# Patient Record
Sex: Male | Born: 1950 | Race: White | Hispanic: No | Marital: Married | Smoking: Never smoker
Health system: Southern US, Community
[De-identification: ages and names within clinical notes are randomized; demographics above are authoritative.]

---

## 1997-11-25 ENCOUNTER — Ambulatory Visit (HOSPITAL_COMMUNITY): Admission: RE | Admit: 1997-11-25 | Discharge: 1997-11-25 | Payer: Self-pay | Admitting: Internal Medicine

## 1999-08-04 ENCOUNTER — Encounter: Payer: Self-pay | Admitting: *Deleted

## 1999-08-04 ENCOUNTER — Ambulatory Visit (HOSPITAL_COMMUNITY): Admission: RE | Admit: 1999-08-04 | Discharge: 1999-08-04 | Payer: Self-pay | Admitting: *Deleted

## 2000-01-18 ENCOUNTER — Ambulatory Visit (HOSPITAL_COMMUNITY): Admission: RE | Admit: 2000-01-18 | Discharge: 2000-01-18 | Payer: Self-pay | Admitting: *Deleted

## 2000-01-18 ENCOUNTER — Encounter (INDEPENDENT_AMBULATORY_CARE_PROVIDER_SITE_OTHER): Payer: Self-pay | Admitting: *Deleted

## 2000-06-16 ENCOUNTER — Encounter (INDEPENDENT_AMBULATORY_CARE_PROVIDER_SITE_OTHER): Payer: Self-pay | Admitting: *Deleted

## 2000-06-16 ENCOUNTER — Ambulatory Visit (HOSPITAL_COMMUNITY): Admission: RE | Admit: 2000-06-16 | Discharge: 2000-06-16 | Payer: Self-pay | Admitting: *Deleted

## 2003-06-17 DIAGNOSIS — D126 Benign neoplasm of colon, unspecified: Secondary | ICD-10-CM | POA: Insufficient documentation

## 2003-06-30 ENCOUNTER — Ambulatory Visit (HOSPITAL_COMMUNITY): Admission: RE | Admit: 2003-06-30 | Discharge: 2003-06-30 | Payer: Self-pay | Admitting: *Deleted

## 2006-05-23 ENCOUNTER — Encounter: Admission: RE | Admit: 2006-05-23 | Discharge: 2006-05-23 | Payer: Self-pay | Admitting: Internal Medicine

## 2006-06-15 ENCOUNTER — Encounter (INDEPENDENT_AMBULATORY_CARE_PROVIDER_SITE_OTHER): Payer: Self-pay | Admitting: *Deleted

## 2006-06-15 ENCOUNTER — Other Ambulatory Visit: Admission: RE | Admit: 2006-06-15 | Discharge: 2006-06-15 | Payer: Self-pay | Admitting: Interventional Radiology

## 2006-06-15 ENCOUNTER — Encounter: Admission: RE | Admit: 2006-06-15 | Discharge: 2006-06-15 | Payer: Self-pay | Admitting: Internal Medicine

## 2006-12-11 ENCOUNTER — Ambulatory Visit: Payer: Self-pay | Admitting: Gastroenterology

## 2006-12-11 LAB — CONVERTED CEMR LAB
ALT: 17 units/L (ref 0–53)
AST: 18 units/L (ref 0–37)
Albumin: 3.9 g/dL (ref 3.5–5.2)
Alkaline Phosphatase: 42 units/L (ref 39–117)
Amylase: 99 units/L (ref 27–131)
BUN: 10 mg/dL (ref 6–23)
Basophils Absolute: 0 10*3/uL (ref 0.0–0.1)
Basophils Relative: 0.8 % (ref 0.0–1.0)
Bilirubin, Direct: 0.1 mg/dL (ref 0.0–0.3)
CO2: 30 meq/L (ref 19–32)
Calcium: 9 mg/dL (ref 8.4–10.5)
Chloride: 100 meq/L (ref 96–112)
Creatinine, Ser: 1 mg/dL (ref 0.4–1.5)
Eosinophils Absolute: 0.1 10*3/uL (ref 0.0–0.6)
Eosinophils Relative: 1 % (ref 0.0–5.0)
Ferritin: 152.1 ng/mL (ref 22.0–322.0)
Folate: 8.8 ng/mL
GFR calc Af Amer: 99 mL/min
GFR calc non Af Amer: 82 mL/min
Glucose, Bld: 102 mg/dL — ABNORMAL HIGH (ref 70–99)
HCT: 40.5 % (ref 39.0–52.0)
Hemoglobin: 13.9 g/dL (ref 13.0–17.0)
Iron: 69 ug/dL (ref 42–165)
Lipase: 32 units/L (ref 11.0–59.0)
Lymphocytes Relative: 27.6 % (ref 12.0–46.0)
MCHC: 34.4 g/dL (ref 30.0–36.0)
MCV: 92.5 fL (ref 78.0–100.0)
Monocytes Absolute: 0.5 10*3/uL (ref 0.2–0.7)
Monocytes Relative: 8.7 % (ref 3.0–11.0)
Neutro Abs: 3.3 10*3/uL (ref 1.4–7.7)
Neutrophils Relative %: 61.9 % (ref 43.0–77.0)
Platelets: 305 10*3/uL (ref 150–400)
Potassium: 4.1 meq/L (ref 3.5–5.1)
RBC: 4.38 M/uL (ref 4.22–5.81)
RDW: 12.4 % (ref 11.5–14.6)
Saturation Ratios: 22.3 % (ref 20.0–50.0)
Sodium: 134 meq/L — ABNORMAL LOW (ref 135–145)
TSH: 0.73 microintl units/mL (ref 0.35–5.50)
Total Bilirubin: 0.8 mg/dL (ref 0.3–1.2)
Total Protein: 6.3 g/dL (ref 6.0–8.3)
Transferrin: 221.1 mg/dL (ref >=212.0)
Vitamin B-12: 319 pg/mL (ref 211–911)
WBC: 5.4 10*3/uL (ref 4.5–10.5)

## 2006-12-15 ENCOUNTER — Ambulatory Visit (HOSPITAL_COMMUNITY): Admission: RE | Admit: 2006-12-15 | Discharge: 2006-12-15 | Payer: Self-pay | Admitting: Gastroenterology

## 2007-01-01 ENCOUNTER — Encounter: Payer: Self-pay | Admitting: Gastroenterology

## 2007-01-01 ENCOUNTER — Ambulatory Visit: Payer: Self-pay | Admitting: Gastroenterology

## 2007-01-01 DIAGNOSIS — K2970 Gastritis, unspecified, without bleeding: Secondary | ICD-10-CM | POA: Insufficient documentation

## 2007-01-01 DIAGNOSIS — K2990 Gastroduodenitis, unspecified, without bleeding: Secondary | ICD-10-CM

## 2007-01-02 ENCOUNTER — Ambulatory Visit: Payer: Self-pay | Admitting: Cardiology

## 2007-07-19 DIAGNOSIS — K219 Gastro-esophageal reflux disease without esophagitis: Secondary | ICD-10-CM | POA: Insufficient documentation

## 2008-08-20 IMAGING — US US SOFT TISSUE HEAD/NECK
1 series · 14 of 25 positions shown · non-contrast
Comparison: none

CLINICAL DATA: Thyroid nodule on physical exam. 
 THYROID ULTRASOUND:
TECHNIQUE: Ultrasound examination of the thyroid gland and adjacent soft tissue structures was performed.

[Series 1: unknown · 0.09mm/px · 14 of 37 slices shown]
[im 1/37]
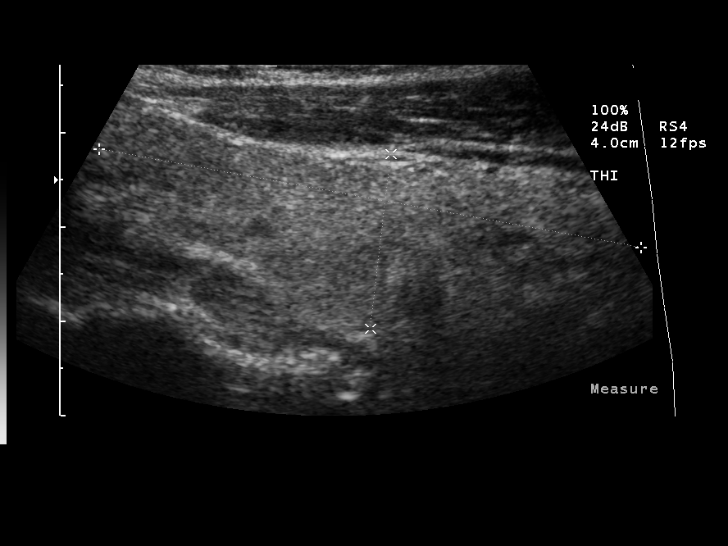
[im 4/37]
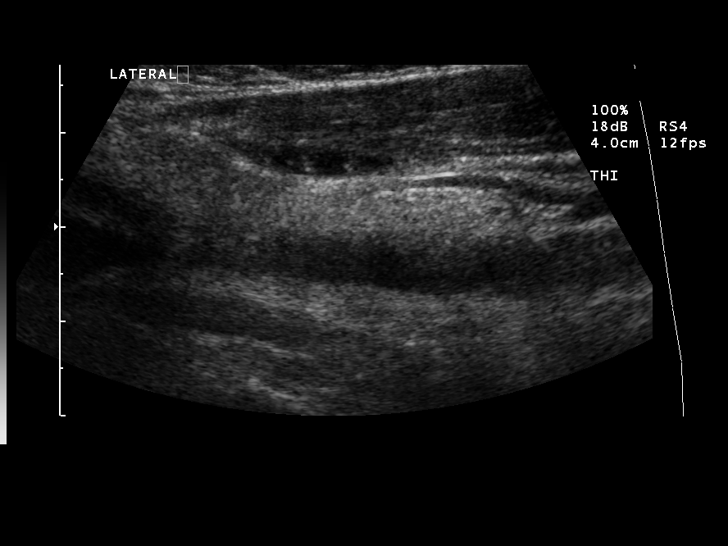
[im 7/37]
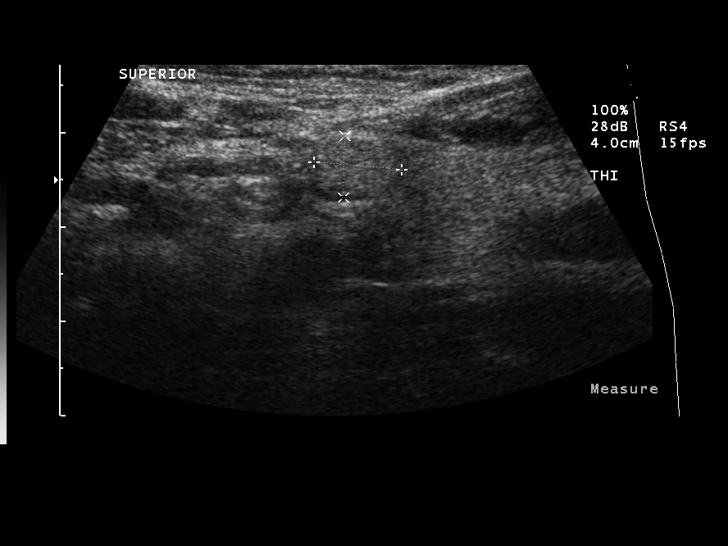
[im 10/37]
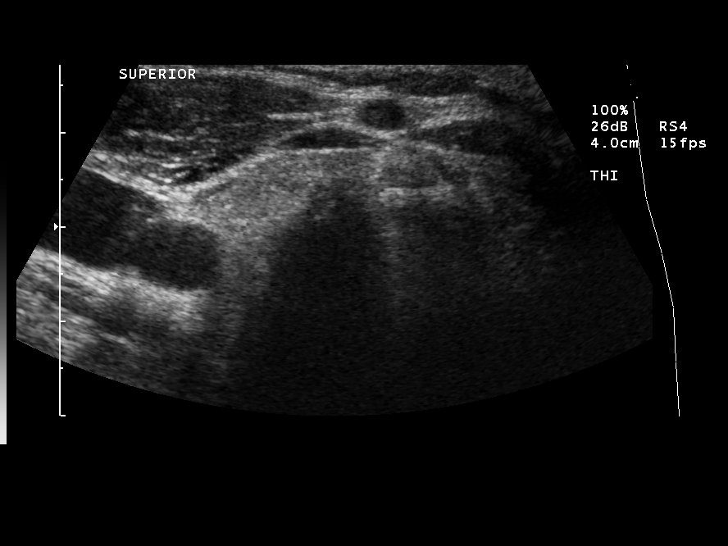
[im 13/37]
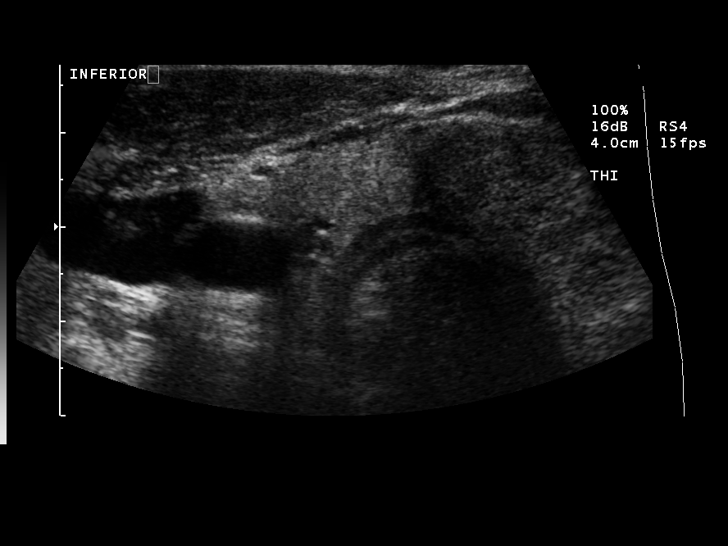
[im 14/37]
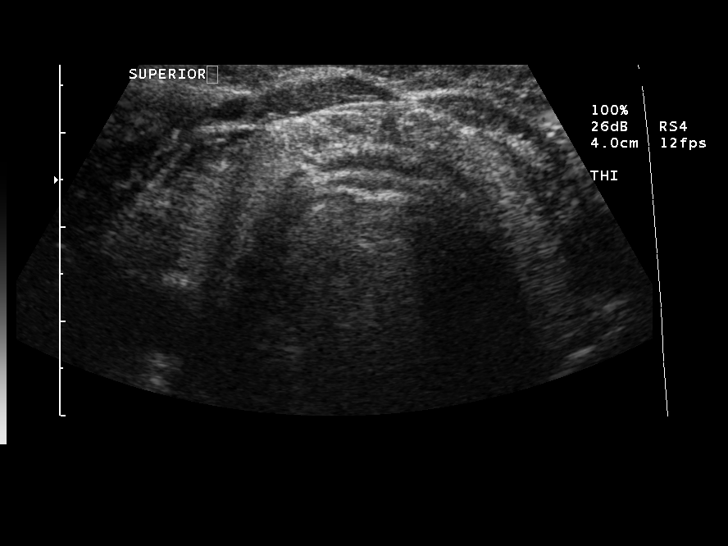
[im 17/37]
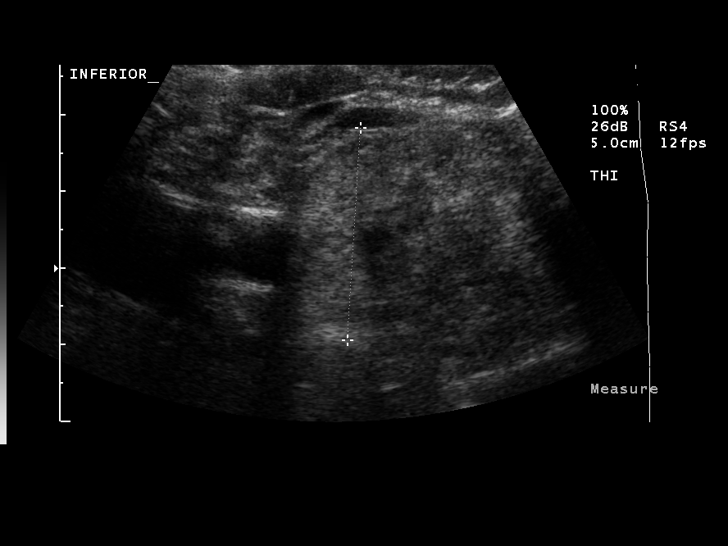
[im 20/37]
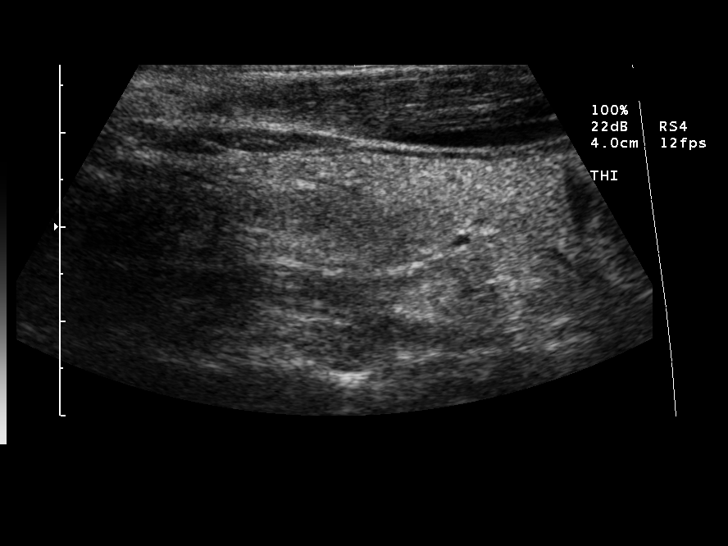
[im 23/37]
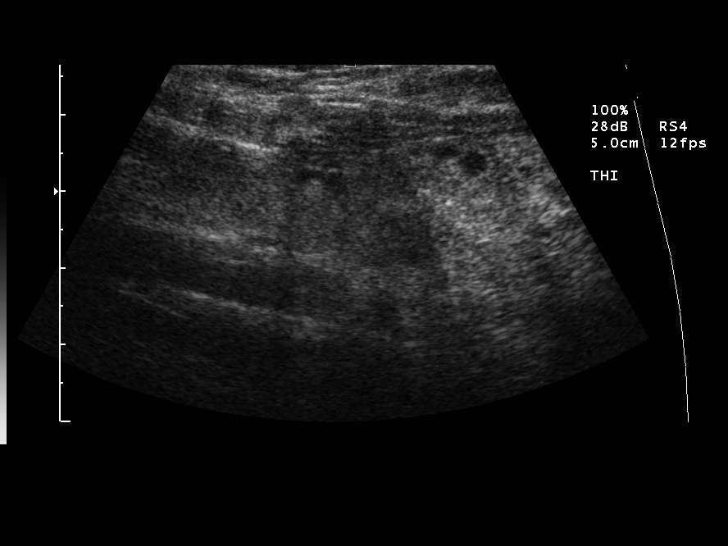
[im 25/37]
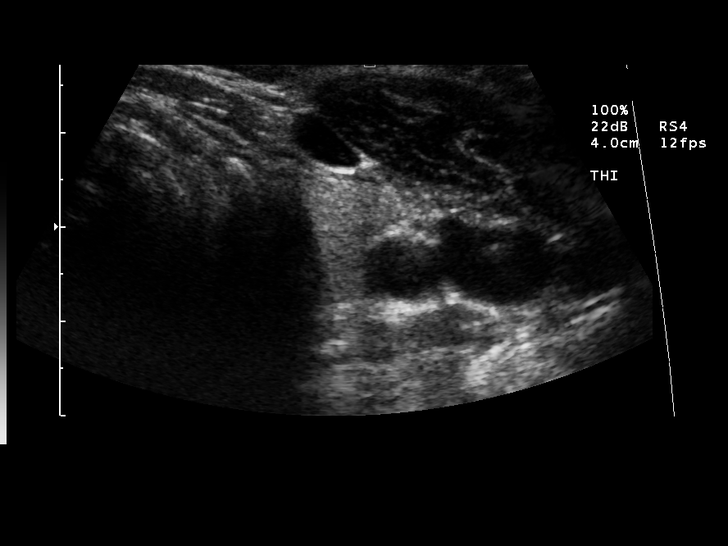
[im 28/37]
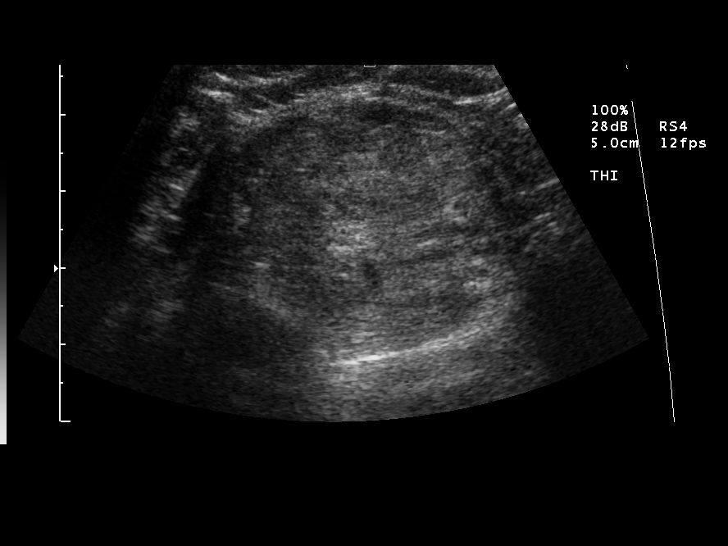
[im 31/37]
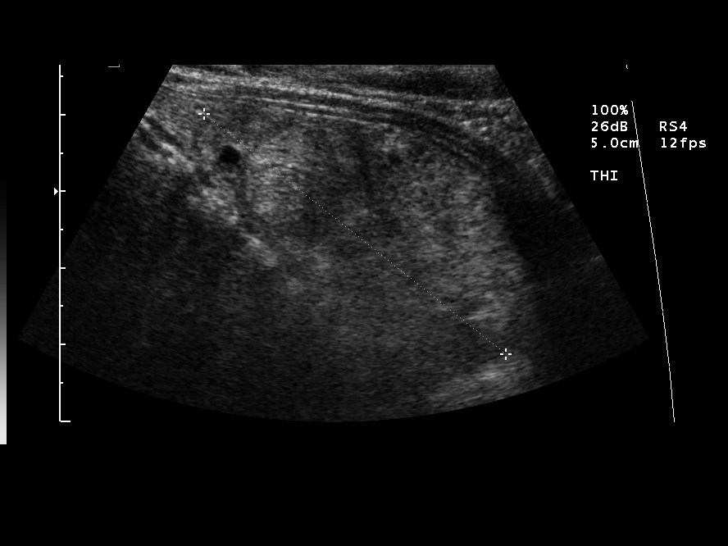
[im 34/37]
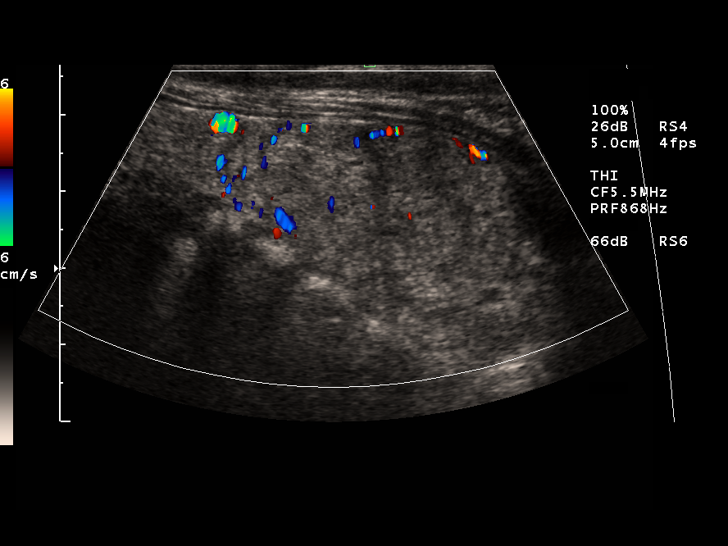
[im 37/37]
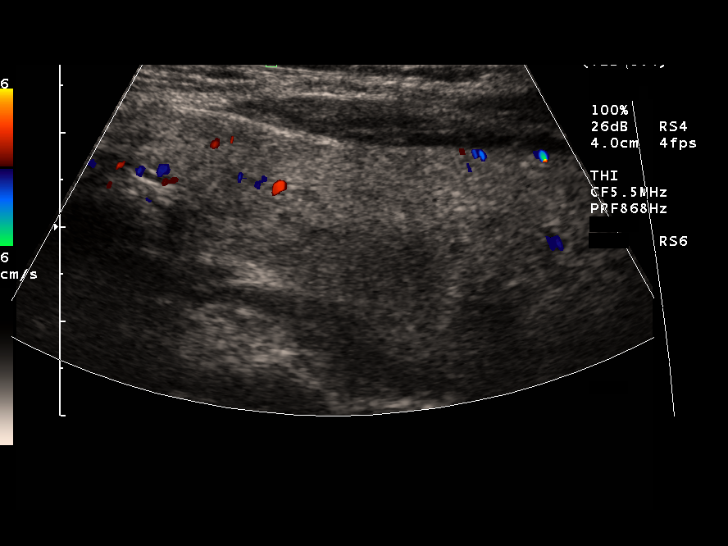

[14 of 25 positions shown; findings below may reference images not displayed]

FINDINGS: Scans over the thyroid gland were performed.   The right lobe of the thyroid is within normal limits in size measuring 5.8 cm sagittally with a depth of 1.9 cm and width of 1.2 cm.  Over the left lobe is enlarged primarily due to a large solid mass in the lower lobe.   The left lobe measures 9.0 x 2.6 x 1.6 cm with the isthmus measuring up to 2.8 cm as a result of this mass.  This mass is primarily solid measuring 5.0 x 2.6 x 5.1 cm and biopsy is recommended.
IMPRESSION: Primarily solid dominant mass in the left lower pole of 5 x 2.6 x 5.1 cm.  Consider biopsy.

## 2010-05-09 ENCOUNTER — Encounter: Payer: Self-pay | Admitting: Internal Medicine

## 2010-08-31 NOTE — Assessment & Plan Note (Signed)
[facility]                         GASTROENTEROLOGY OFFICE NOTE   GARRETTE, CAINE                         MRN:          [id number]  DATE:12/11/2006                            DOB:          03-Nov-1950    Mr. Errico is a patient of Dr. Zollie Beckers Pharr's and Dr. Sabino Gasser.  He is  self-referred today for evaluation of persistent recurrent chest pain.   Mr. Broadnax has a long history of acid reflux, has had endoscopies and  colonoscopies in the past with Dr. Virginia Rochester.  His last procedure was several  years ago and showed no evidence of Barrett's mucosa.  He has had  several colon polyps with followup colonoscopy also in March of 2005  which was normal.   For the last several weeks Adi has had constant pressing pain in his  chest area with regurgitation, belching and burping.  He has had no  associated dysphagia or hepatobiliary complaints.  He has been switched  from Prilosec to Protonix and has had his dose increased from 40 mg a  day to twice a day dosing, continues to be symptomatic.  Associated with  this has been panic attacks and anxiety, partially relieved by taking  Klonopin 0.5 mg twice a day.  The patient does use p.r.n. antacids.  He  additionally has insomnia, takes Ambien 12.5 mg at bedtime.   He denies clay colored stools, dark urine, icterus, fever or chills,  anorexia or weight loss.  He says he has had a cardiac evaluation at  [facility] which has been unremarkable.  It is unclear to me if  he has had a stress test.  He gives no history of hyperlipidemia,  hypertension, or family history or heart disease.  He has made multiple  dietary adjustment, he is on a fairly strict diet to control his acid  reflux but continues to be symptomatic.  I can not see where he has ever  had gallbladder ultrasound exam.   Patient in the past has had barium swallows with exam in April of 2001  showing evidence of reflux esophagitis.  The patient also  has had a  recent thyroid biopsy which was unremarkable in February of 2008.   Past medical history otherwise remarkable for possible depression.  He  has been on Zoloft and Effexor but could not tolerate these medications  because of worsening of his chest pain.  He has never had previous  abdominal surgery.   FAMILY HISTORY:  Entirely noncontributory.   SOCIAL HISTORY:  He is married, lives with his wife.  He has a Systems analyst.  Works as an Arts development officer.  He does not smoke and has  not ever been a heavy user of alcohol.   MEDICATIONS:  1. Protonix 40 mg twice a day.  2. Ambien 12.5 mg nightly.  3. Klonopin 0.25 mg twice a day.  4. Vitamin E 400 units a day.   HE HAS A HISTORY OF SULFUR SENSITIVITY.   REVIEW OF SYSTEMS:  Noncontributory except for new onset anxiety,  depression and insomnia.  He denies any  hallucinations, delusions,  suicidal thoughts.  He denies chest pain on exertion, palpitations,  cough, sputum production, hemoptysis, fever or chills.  Review of  systems otherwise noncontributory.   EXAMINATION:  He is a healthy-appearing white male in no distress  appearing his stated age.  I can not appreciate stigmata of chronic  liver disease.  He is 6 feet 2 inches tall and weighs 179 pounds.  Blood  pressure 126/72 and pulse was 72 and regular.  Could not appreciate  thyromegaly or lymphadenopathy.  CHEST:  Clear to percussion and auscultation.  I could not appreciate  murmurs, gallops or rubs.  He appeared to be in a regular rhythm.  There was no hepatosplenomegaly, abdominal masses or tenderness.  He had  some discomfort of his xyphoid process.  Abdominal exam otherwise was  unremarkable and bowel sounds were normal.  PERIPHERAL EXTREMITIES:  Unremarkable.  Mental status was clear.   ASSESSMENT:  1. Chronic acid reflux - rule out cholelithiasis.  2. A lot of his symptoms do seem consistent with anxiety and panic      attacks.  3. History of colon  polyps with routine colonoscopy by Dr. Virginia Rochester.   RECOMMENDATIONS:  1. Continue reflux regimen and twice a day PPI therapy and I will add      Carafate suspension 1 tablespoon after meals and at bedtime.  2. P.r.n. Levsin every 6-8 hours as needed.  3. Strict anti-reflux maneuvers reviewed with patient.  4. Outpatient ultrasound and endoscopy.  5. Check screening laboratory parameters including liver functios,      amylase, lipase, sed rate.  6. Consider exercise stress testing as needed depending on his      clinical course and clinical workup.     Vania Rea. Jarold Motto, MD, Caleen Essex, FAGA  Electronically Signed    DRP/MedQ  DD: 12/11/2006  DT: 12/12/2006  Job #: [id number]   cc:   Soyla Murphy. Renne Crigler, M.D.

## 2010-09-03 NOTE — Op Note (Signed)
NAME:  Robert Cline, Robert Cline                            ACCOUNT NO.:  [id number]   MEDICAL RECORD NO.:  [id number]                   PATIENT TYPE:  AMB   LOCATION:  ENDO                                 FACILITY:  [facility]   PHYSICIAN:  Georgiana Spinner, M.D.                 DATE OF BIRTH:  21-Dec-1950   DATE OF PROCEDURE:  DATE OF DISCHARGE:                                 OPERATIVE REPORT   PROCEDURE:  Colonoscopy.   INDICATIONS:  Colon polyps.   ANESTHESIA:  Demerol 60 mg, Versed 5 mg IV.   DESCRIPTION OF PROCEDURE:  With the patient mildly sedated and in the left  lateral decubitus position, a rectal exam was performed which was  unremarkable.  Subsequently the Olympus videoscopic colonoscope was inserted  in the rectum and passed under direct vision to the cecum, identified by  ileocecal valve and appendiceal orifice, both of which were photographed.  From this point,  the colonoscope was slowly withdrawn, taking  circumferential views of the colonic mucosa, stopping in the rectum which  appeared normal on direct and showed small hemorrhoids on retroflexed view.  The endoscope was straightened and withdrawn.  The patient's vital signs and  pulse oximetry remained stable.  The patient tolerated the procedure well  without apparent complications.   FINDINGS:  Internal hemorrhoids; otherwise unremarkable exam.   PLAN:  Repeat examination in five years due to his previous polyps.                                               Georgiana Spinner, M.D.    GMO/MEDQ  D:  06/30/2003  T:  06/30/2003  Job:  [id number]

## 2010-09-03 NOTE — Procedures (Signed)
[place]  Patient:    Robert Cline, Robert Cline                           MRN: [id number] Adm. Date:  [id number] Attending:  Sabino Gasser                           Procedure Report  PROCEDURE:  Colonoscopy.  INDICATION:  Colon polyps.  ANESTHESIA:  Demerol 50 mg, Versed 3 mg.  DESCRIPTION OF PROCEDURE:  With patient mildly sedated in the left lateral decubitus position, a rectal exam was performed.  Subsequently, the Olympus videoscopic colonoscope, variable-stiffness, was inserted in the rectum, passed under direct vision to the cecum, identified by the ileocecal valve and appendiceal orifice, both of which were photographed.  From this point, the colonoscope was slowly withdrawn, taking circumferential views of the entire colonic mucosa, stopping in the descending colon, where a small polyp was seen, photographed, and removed using hot biopsy forceps technique staying with 20-20 blended current.  The endoscope was withdrawn all the way to the rectum, which appeared normal on direct and retroflex view.  The endoscope was straightened and withdrawn.  The patients vital signs and pulse oximetry remained stable.  The patient tolerated the procedure well with no apparent complications.  FINDINGS:  Polyp of descending colon, removed.  Await biopsy report.  The patient will call me for results and follow up as an outpatient. DD:  06/16/00 TD:  06/16/00 Job: [id number]

## 2012-10-12 ENCOUNTER — Other Ambulatory Visit (HOSPITAL_COMMUNITY): Payer: Self-pay | Admitting: Urology

## 2012-10-12 DIAGNOSIS — R972 Elevated prostate specific antigen [PSA]: Secondary | ICD-10-CM

## 2012-10-31 ENCOUNTER — Other Ambulatory Visit (HOSPITAL_COMMUNITY): Payer: Self-pay

## 2012-10-31 ENCOUNTER — Ambulatory Visit (HOSPITAL_COMMUNITY)
Admission: RE | Admit: 2012-10-31 | Discharge: 2012-10-31 | Disposition: A | Discharge status: Home / Self Care | Payer: BC Managed Care – PPO | Source: Ambulatory Visit | Attending: Urology | Admitting: Urology

## 2012-10-31 DIAGNOSIS — R972 Elevated prostate specific antigen [PSA]: Secondary | ICD-10-CM

## 2012-10-31 DIAGNOSIS — N402 Nodular prostate without lower urinary tract symptoms: Secondary | ICD-10-CM | POA: Insufficient documentation

## 2012-10-31 MED ORDER — GADOBENATE DIMEGLUMINE 529 MG/ML IV SOLN
17.0000 mL | Freq: Once | INTRAVENOUS | Status: AC | PRN
  Administered 2012-10-31: 17 mL via INTRAVENOUS

## 2012-11-12 ENCOUNTER — Encounter: Payer: Self-pay | Admitting: Cardiology

## 2013-02-27 ENCOUNTER — Encounter: Payer: Self-pay | Admitting: Cardiovascular Disease

## 2014-10-24 DIAGNOSIS — Z125 Encounter for screening for malignant neoplasm of prostate: Secondary | ICD-10-CM | POA: Diagnosis not present

## 2014-10-24 DIAGNOSIS — D72819 Decreased white blood cell count, unspecified: Secondary | ICD-10-CM | POA: Diagnosis not present

## 2014-10-24 DIAGNOSIS — K219 Gastro-esophageal reflux disease without esophagitis: Secondary | ICD-10-CM | POA: Diagnosis not present

## 2014-11-07 DIAGNOSIS — Z23 Encounter for immunization: Secondary | ICD-10-CM | POA: Diagnosis not present

## 2014-11-07 DIAGNOSIS — D72819 Decreased white blood cell count, unspecified: Secondary | ICD-10-CM | POA: Diagnosis not present

## 2014-11-07 DIAGNOSIS — F5104 Psychophysiologic insomnia: Secondary | ICD-10-CM | POA: Diagnosis not present

## 2014-11-07 DIAGNOSIS — Z87891 Personal history of nicotine dependence: Secondary | ICD-10-CM | POA: Diagnosis not present

## 2014-11-07 DIAGNOSIS — N401 Enlarged prostate with lower urinary tract symptoms: Secondary | ICD-10-CM | POA: Diagnosis not present

## 2014-12-03 DIAGNOSIS — N390 Urinary tract infection, site not specified: Secondary | ICD-10-CM | POA: Diagnosis not present

## 2014-12-03 DIAGNOSIS — F411 Generalized anxiety disorder: Secondary | ICD-10-CM | POA: Diagnosis not present

## 2015-02-13 DIAGNOSIS — R972 Elevated prostate specific antigen [PSA]: Secondary | ICD-10-CM | POA: Diagnosis not present

## 2015-02-13 DIAGNOSIS — N400 Enlarged prostate without lower urinary tract symptoms: Secondary | ICD-10-CM | POA: Diagnosis not present

## 2015-02-16 DIAGNOSIS — Z23 Encounter for immunization: Secondary | ICD-10-CM | POA: Diagnosis not present

## 2015-02-16 DIAGNOSIS — J387 Other diseases of larynx: Secondary | ICD-10-CM | POA: Diagnosis not present

## 2015-02-16 DIAGNOSIS — R972 Elevated prostate specific antigen [PSA]: Secondary | ICD-10-CM | POA: Diagnosis not present

## 2015-03-25 DIAGNOSIS — F411 Generalized anxiety disorder: Secondary | ICD-10-CM | POA: Diagnosis not present

## 2015-05-11 DIAGNOSIS — Z86010 Personal history of colonic polyps: Secondary | ICD-10-CM | POA: Diagnosis not present

## 2015-05-11 DIAGNOSIS — K210 Gastro-esophageal reflux disease with esophagitis: Secondary | ICD-10-CM | POA: Diagnosis not present

## 2015-05-19 DIAGNOSIS — R972 Elevated prostate specific antigen [PSA]: Secondary | ICD-10-CM | POA: Diagnosis not present

## 2015-05-21 DIAGNOSIS — R972 Elevated prostate specific antigen [PSA]: Secondary | ICD-10-CM | POA: Diagnosis not present

## 2015-05-27 ENCOUNTER — Other Ambulatory Visit: Payer: Self-pay | Admitting: Gastroenterology

## 2015-05-27 DIAGNOSIS — D126 Benign neoplasm of colon, unspecified: Secondary | ICD-10-CM | POA: Diagnosis not present

## 2015-05-27 DIAGNOSIS — K621 Rectal polyp: Secondary | ICD-10-CM | POA: Diagnosis not present

## 2015-05-27 DIAGNOSIS — K219 Gastro-esophageal reflux disease without esophagitis: Secondary | ICD-10-CM | POA: Diagnosis not present

## 2015-05-27 DIAGNOSIS — K449 Diaphragmatic hernia without obstruction or gangrene: Secondary | ICD-10-CM | POA: Diagnosis not present

## 2015-05-27 DIAGNOSIS — D125 Benign neoplasm of sigmoid colon: Secondary | ICD-10-CM | POA: Diagnosis not present

## 2015-05-27 DIAGNOSIS — K5730 Diverticulosis of large intestine without perforation or abscess without bleeding: Secondary | ICD-10-CM | POA: Diagnosis not present

## 2015-05-27 DIAGNOSIS — Z86010 Personal history of colonic polyps: Secondary | ICD-10-CM | POA: Diagnosis not present

## 2015-06-10 DIAGNOSIS — Z0000 Encounter for general adult medical examination without abnormal findings: Secondary | ICD-10-CM | POA: Diagnosis not present

## 2015-06-10 DIAGNOSIS — R972 Elevated prostate specific antigen [PSA]: Secondary | ICD-10-CM | POA: Diagnosis not present

## 2015-06-22 DIAGNOSIS — R400 Somnolence: Secondary | ICD-10-CM | POA: Diagnosis not present

## 2015-06-22 DIAGNOSIS — R972 Elevated prostate specific antigen [PSA]: Secondary | ICD-10-CM | POA: Diagnosis not present

## 2015-06-22 DIAGNOSIS — Z0000 Encounter for general adult medical examination without abnormal findings: Secondary | ICD-10-CM | POA: Diagnosis not present

## 2015-06-22 DIAGNOSIS — N400 Enlarged prostate without lower urinary tract symptoms: Secondary | ICD-10-CM | POA: Diagnosis not present

## 2015-06-26 DIAGNOSIS — H18891 Other specified disorders of cornea, right eye: Secondary | ICD-10-CM | POA: Diagnosis not present

## 2015-06-26 DIAGNOSIS — Q134 Other congenital corneal malformations: Secondary | ICD-10-CM | POA: Diagnosis not present

## 2015-07-22 DIAGNOSIS — E0789 Other specified disorders of thyroid: Secondary | ICD-10-CM | POA: Diagnosis not present

## 2015-07-29 DIAGNOSIS — E032 Hypothyroidism due to medicaments and other exogenous substances: Secondary | ICD-10-CM | POA: Diagnosis not present

## 2015-08-28 DIAGNOSIS — H2513 Age-related nuclear cataract, bilateral: Secondary | ICD-10-CM | POA: Diagnosis not present

## 2015-08-28 DIAGNOSIS — H40013 Open angle with borderline findings, low risk, bilateral: Secondary | ICD-10-CM | POA: Diagnosis not present

## 2015-08-28 DIAGNOSIS — H25013 Cortical age-related cataract, bilateral: Secondary | ICD-10-CM | POA: Diagnosis not present

## 2015-08-28 DIAGNOSIS — H43813 Vitreous degeneration, bilateral: Secondary | ICD-10-CM | POA: Diagnosis not present

## 2015-09-09 DIAGNOSIS — F411 Generalized anxiety disorder: Secondary | ICD-10-CM | POA: Diagnosis not present

## 2015-11-04 DIAGNOSIS — N183 Chronic kidney disease, stage 3 (moderate): Secondary | ICD-10-CM | POA: Diagnosis not present

## 2015-11-04 DIAGNOSIS — D72819 Decreased white blood cell count, unspecified: Secondary | ICD-10-CM | POA: Diagnosis not present

## 2015-11-04 DIAGNOSIS — K219 Gastro-esophageal reflux disease without esophagitis: Secondary | ICD-10-CM | POA: Diagnosis not present

## 2015-11-04 DIAGNOSIS — Z0000 Encounter for general adult medical examination without abnormal findings: Secondary | ICD-10-CM | POA: Diagnosis not present

## 2015-11-11 DIAGNOSIS — N486 Induration penis plastica: Secondary | ICD-10-CM | POA: Diagnosis not present

## 2015-11-11 DIAGNOSIS — D72819 Decreased white blood cell count, unspecified: Secondary | ICD-10-CM | POA: Diagnosis not present

## 2015-11-11 DIAGNOSIS — L570 Actinic keratosis: Secondary | ICD-10-CM | POA: Diagnosis not present

## 2015-11-11 DIAGNOSIS — J069 Acute upper respiratory infection, unspecified: Secondary | ICD-10-CM | POA: Diagnosis not present

## 2015-11-11 DIAGNOSIS — F419 Anxiety disorder, unspecified: Secondary | ICD-10-CM | POA: Diagnosis not present

## 2015-11-11 DIAGNOSIS — G4733 Obstructive sleep apnea (adult) (pediatric): Secondary | ICD-10-CM | POA: Diagnosis not present

## 2015-11-26 DIAGNOSIS — G4733 Obstructive sleep apnea (adult) (pediatric): Secondary | ICD-10-CM | POA: Diagnosis not present

## 2015-12-28 DIAGNOSIS — R972 Elevated prostate specific antigen [PSA]: Secondary | ICD-10-CM | POA: Diagnosis not present

## 2016-01-18 DIAGNOSIS — R972 Elevated prostate specific antigen [PSA]: Secondary | ICD-10-CM | POA: Diagnosis not present

## 2016-01-18 DIAGNOSIS — N400 Enlarged prostate without lower urinary tract symptoms: Secondary | ICD-10-CM | POA: Diagnosis not present

## 2016-02-22 DIAGNOSIS — H179 Unspecified corneal scar and opacity: Secondary | ICD-10-CM | POA: Diagnosis not present

## 2016-02-22 DIAGNOSIS — H40013 Open angle with borderline findings, low risk, bilateral: Secondary | ICD-10-CM | POA: Diagnosis not present

## 2016-02-23 DIAGNOSIS — Z23 Encounter for immunization: Secondary | ICD-10-CM | POA: Diagnosis not present

## 2016-03-02 DIAGNOSIS — F411 Generalized anxiety disorder: Secondary | ICD-10-CM | POA: Diagnosis not present

## 2016-04-27 DIAGNOSIS — R69 Illness, unspecified: Secondary | ICD-10-CM | POA: Diagnosis not present

## 2016-06-20 DIAGNOSIS — R972 Elevated prostate specific antigen [PSA]: Secondary | ICD-10-CM | POA: Diagnosis not present

## 2016-06-27 DIAGNOSIS — R972 Elevated prostate specific antigen [PSA]: Secondary | ICD-10-CM | POA: Diagnosis not present

## 2016-06-27 DIAGNOSIS — N400 Enlarged prostate without lower urinary tract symptoms: Secondary | ICD-10-CM | POA: Diagnosis not present

## 2016-07-22 DIAGNOSIS — E0789 Other specified disorders of thyroid: Secondary | ICD-10-CM | POA: Diagnosis not present

## 2016-08-03 DIAGNOSIS — G4733 Obstructive sleep apnea (adult) (pediatric): Secondary | ICD-10-CM | POA: Diagnosis not present

## 2016-08-03 DIAGNOSIS — N400 Enlarged prostate without lower urinary tract symptoms: Secondary | ICD-10-CM | POA: Diagnosis not present

## 2016-08-03 DIAGNOSIS — E032 Hypothyroidism due to medicaments and other exogenous substances: Secondary | ICD-10-CM | POA: Diagnosis not present

## 2016-08-29 DIAGNOSIS — H40013 Open angle with borderline findings, low risk, bilateral: Secondary | ICD-10-CM | POA: Diagnosis not present

## 2016-08-29 DIAGNOSIS — H43813 Vitreous degeneration, bilateral: Secondary | ICD-10-CM | POA: Diagnosis not present

## 2016-08-29 DIAGNOSIS — H2513 Age-related nuclear cataract, bilateral: Secondary | ICD-10-CM | POA: Diagnosis not present

## 2016-08-29 DIAGNOSIS — H25013 Cortical age-related cataract, bilateral: Secondary | ICD-10-CM | POA: Diagnosis not present

## 2016-09-06 DIAGNOSIS — R69 Illness, unspecified: Secondary | ICD-10-CM | POA: Diagnosis not present

## 2016-10-26 DIAGNOSIS — R69 Illness, unspecified: Secondary | ICD-10-CM | POA: Diagnosis not present

## 2016-12-09 DIAGNOSIS — D72819 Decreased white blood cell count, unspecified: Secondary | ICD-10-CM | POA: Diagnosis not present

## 2016-12-09 DIAGNOSIS — N183 Chronic kidney disease, stage 3 (moderate): Secondary | ICD-10-CM | POA: Diagnosis not present

## 2016-12-09 DIAGNOSIS — Z13220 Encounter for screening for lipoid disorders: Secondary | ICD-10-CM | POA: Diagnosis not present

## 2016-12-09 DIAGNOSIS — Z125 Encounter for screening for malignant neoplasm of prostate: Secondary | ICD-10-CM | POA: Diagnosis not present

## 2016-12-20 DIAGNOSIS — Z0001 Encounter for general adult medical examination with abnormal findings: Secondary | ICD-10-CM | POA: Diagnosis not present

## 2016-12-20 DIAGNOSIS — N401 Enlarged prostate with lower urinary tract symptoms: Secondary | ICD-10-CM | POA: Diagnosis not present

## 2016-12-20 DIAGNOSIS — L570 Actinic keratosis: Secondary | ICD-10-CM | POA: Diagnosis not present

## 2016-12-20 DIAGNOSIS — Z86010 Personal history of colonic polyps: Secondary | ICD-10-CM | POA: Diagnosis not present

## 2016-12-21 DIAGNOSIS — R972 Elevated prostate specific antigen [PSA]: Secondary | ICD-10-CM | POA: Diagnosis not present

## 2017-01-16 DIAGNOSIS — N400 Enlarged prostate without lower urinary tract symptoms: Secondary | ICD-10-CM | POA: Diagnosis not present

## 2017-01-16 DIAGNOSIS — R972 Elevated prostate specific antigen [PSA]: Secondary | ICD-10-CM | POA: Diagnosis not present

## 2017-01-31 DIAGNOSIS — R69 Illness, unspecified: Secondary | ICD-10-CM | POA: Diagnosis not present

## 2017-03-02 DIAGNOSIS — R69 Illness, unspecified: Secondary | ICD-10-CM | POA: Diagnosis not present

## 2017-03-03 DIAGNOSIS — H40013 Open angle with borderline findings, low risk, bilateral: Secondary | ICD-10-CM | POA: Diagnosis not present

## 2017-04-20 DIAGNOSIS — E0789 Other specified disorders of thyroid: Secondary | ICD-10-CM | POA: Diagnosis not present

## 2017-04-24 DIAGNOSIS — E042 Nontoxic multinodular goiter: Secondary | ICD-10-CM | POA: Diagnosis not present

## 2017-04-24 DIAGNOSIS — E039 Hypothyroidism, unspecified: Secondary | ICD-10-CM | POA: Diagnosis not present

## 2017-04-24 DIAGNOSIS — M8580 Other specified disorders of bone density and structure, unspecified site: Secondary | ICD-10-CM | POA: Diagnosis not present

## 2017-04-24 DIAGNOSIS — E041 Nontoxic single thyroid nodule: Secondary | ICD-10-CM | POA: Diagnosis not present

## 2017-04-25 ENCOUNTER — Other Ambulatory Visit: Payer: Self-pay | Admitting: Internal Medicine

## 2017-04-25 DIAGNOSIS — E049 Nontoxic goiter, unspecified: Secondary | ICD-10-CM

## 2017-04-26 DIAGNOSIS — M810 Age-related osteoporosis without current pathological fracture: Secondary | ICD-10-CM | POA: Diagnosis not present

## 2017-04-28 ENCOUNTER — Ambulatory Visit
Admission: RE | Admit: 2017-04-28 | Discharge: 2017-04-28 | Disposition: A | Discharge status: Home / Self Care | Payer: Medicare HMO | Source: Ambulatory Visit | Attending: Internal Medicine | Admitting: Internal Medicine

## 2017-04-28 DIAGNOSIS — E042 Nontoxic multinodular goiter: Secondary | ICD-10-CM | POA: Diagnosis not present

## 2017-04-28 DIAGNOSIS — E049 Nontoxic goiter, unspecified: Secondary | ICD-10-CM

## 2017-05-09 DIAGNOSIS — R69 Illness, unspecified: Secondary | ICD-10-CM | POA: Diagnosis not present

## 2017-05-12 ENCOUNTER — Other Ambulatory Visit: Payer: Self-pay | Admitting: Internal Medicine

## 2017-05-12 DIAGNOSIS — E041 Nontoxic single thyroid nodule: Secondary | ICD-10-CM

## 2017-05-18 ENCOUNTER — Ambulatory Visit
Admission: RE | Admit: 2017-05-18 | Discharge: 2017-05-18 | Disposition: A | Discharge status: Home / Self Care | Payer: Medicare HMO | Source: Ambulatory Visit | Attending: Internal Medicine | Admitting: Internal Medicine

## 2017-05-18 ENCOUNTER — Other Ambulatory Visit: Payer: Self-pay | Admitting: Internal Medicine

## 2017-05-18 DIAGNOSIS — E041 Nontoxic single thyroid nodule: Secondary | ICD-10-CM

## 2017-07-14 DIAGNOSIS — E042 Nontoxic multinodular goiter: Secondary | ICD-10-CM | POA: Diagnosis not present

## 2017-07-14 DIAGNOSIS — E559 Vitamin D deficiency, unspecified: Secondary | ICD-10-CM | POA: Diagnosis not present

## 2017-07-14 DIAGNOSIS — M8580 Other specified disorders of bone density and structure, unspecified site: Secondary | ICD-10-CM | POA: Diagnosis not present

## 2017-07-20 DIAGNOSIS — R972 Elevated prostate specific antigen [PSA]: Secondary | ICD-10-CM | POA: Diagnosis not present

## 2017-07-24 DIAGNOSIS — E041 Nontoxic single thyroid nodule: Secondary | ICD-10-CM | POA: Diagnosis not present

## 2017-07-24 DIAGNOSIS — M8580 Other specified disorders of bone density and structure, unspecified site: Secondary | ICD-10-CM | POA: Diagnosis not present

## 2017-07-24 DIAGNOSIS — E039 Hypothyroidism, unspecified: Secondary | ICD-10-CM | POA: Diagnosis not present

## 2017-07-24 DIAGNOSIS — E042 Nontoxic multinodular goiter: Secondary | ICD-10-CM | POA: Diagnosis not present

## 2017-08-28 DIAGNOSIS — R972 Elevated prostate specific antigen [PSA]: Secondary | ICD-10-CM | POA: Diagnosis not present

## 2017-08-28 DIAGNOSIS — N400 Enlarged prostate without lower urinary tract symptoms: Secondary | ICD-10-CM | POA: Diagnosis not present

## 2017-10-27 DIAGNOSIS — H40013 Open angle with borderline findings, low risk, bilateral: Secondary | ICD-10-CM | POA: Diagnosis not present

## 2017-10-27 DIAGNOSIS — H2513 Age-related nuclear cataract, bilateral: Secondary | ICD-10-CM | POA: Diagnosis not present

## 2017-10-27 DIAGNOSIS — H35033 Hypertensive retinopathy, bilateral: Secondary | ICD-10-CM | POA: Diagnosis not present

## 2017-10-27 DIAGNOSIS — H43812 Vitreous degeneration, left eye: Secondary | ICD-10-CM | POA: Diagnosis not present

## 2017-11-08 DIAGNOSIS — R69 Illness, unspecified: Secondary | ICD-10-CM | POA: Diagnosis not present

## 2017-12-12 DIAGNOSIS — R972 Elevated prostate specific antigen [PSA]: Secondary | ICD-10-CM | POA: Diagnosis not present

## 2017-12-12 DIAGNOSIS — N400 Enlarged prostate without lower urinary tract symptoms: Secondary | ICD-10-CM | POA: Diagnosis not present

## 2017-12-25 DIAGNOSIS — N183 Chronic kidney disease, stage 3 (moderate): Secondary | ICD-10-CM | POA: Diagnosis not present

## 2017-12-25 DIAGNOSIS — D72819 Decreased white blood cell count, unspecified: Secondary | ICD-10-CM | POA: Diagnosis not present

## 2017-12-25 DIAGNOSIS — Z79899 Other long term (current) drug therapy: Secondary | ICD-10-CM | POA: Diagnosis not present

## 2017-12-25 DIAGNOSIS — Z125 Encounter for screening for malignant neoplasm of prostate: Secondary | ICD-10-CM | POA: Diagnosis not present

## 2017-12-28 DIAGNOSIS — Z0000 Encounter for general adult medical examination without abnormal findings: Secondary | ICD-10-CM | POA: Diagnosis not present

## 2017-12-28 DIAGNOSIS — N486 Induration penis plastica: Secondary | ICD-10-CM | POA: Diagnosis not present

## 2017-12-28 DIAGNOSIS — L570 Actinic keratosis: Secondary | ICD-10-CM | POA: Diagnosis not present

## 2017-12-28 DIAGNOSIS — R69 Illness, unspecified: Secondary | ICD-10-CM | POA: Diagnosis not present

## 2017-12-28 DIAGNOSIS — M7740 Metatarsalgia, unspecified foot: Secondary | ICD-10-CM | POA: Diagnosis not present

## 2017-12-28 DIAGNOSIS — E049 Nontoxic goiter, unspecified: Secondary | ICD-10-CM | POA: Diagnosis not present

## 2017-12-28 DIAGNOSIS — Z23 Encounter for immunization: Secondary | ICD-10-CM | POA: Diagnosis not present

## 2017-12-28 DIAGNOSIS — N401 Enlarged prostate with lower urinary tract symptoms: Secondary | ICD-10-CM | POA: Diagnosis not present

## 2018-02-16 DIAGNOSIS — F411 Generalized anxiety disorder: Secondary | ICD-10-CM

## 2018-02-16 DIAGNOSIS — F3342 Major depressive disorder, recurrent, in full remission: Secondary | ICD-10-CM

## 2018-03-05 ENCOUNTER — Ambulatory Visit (INDEPENDENT_AMBULATORY_CARE_PROVIDER_SITE_OTHER): Payer: Medicare HMO | Admitting: Psychiatry

## 2018-03-05 ENCOUNTER — Encounter (INDEPENDENT_AMBULATORY_CARE_PROVIDER_SITE_OTHER): Payer: Self-pay

## 2018-03-05 ENCOUNTER — Encounter: Payer: Self-pay | Admitting: Psychiatry

## 2018-03-05 VITALS — BP 114/76 | HR 68 | Ht 73.0 in | Wt 201.0 lb

## 2018-03-05 DIAGNOSIS — F411 Generalized anxiety disorder: Secondary | ICD-10-CM | POA: Diagnosis not present

## 2018-03-05 DIAGNOSIS — F3342 Major depressive disorder, recurrent, in full remission: Secondary | ICD-10-CM | POA: Diagnosis not present

## 2018-03-05 DIAGNOSIS — R69 Illness, unspecified: Secondary | ICD-10-CM | POA: Diagnosis not present

## 2018-03-05 MED ORDER — MIRTAZAPINE 45 MG PO TABS: 45.0000 mg | ORAL_TABLET | Freq: Every day | ORAL | 3 refills | Status: DC

## 2018-03-05 NOTE — Progress Notes (Signed)
[facility]  Patient ID: Robert Cline,  MRN: [id number]  PCP: Deland Pretty, MD  Date of Evaluation: 03/05/2018 Time spent:20 minutes  Chief Complaint:  Chief Complaint    Anxiety; Depression      HISTORY/CURRENT STATUS: Robert Cline is seen individually face-to-face with consent not collateral for psychiatric interview and exam in evaluation and management of recurrent major depression and generalized anxiety.  He reviews treatment by Dr. Candis Schatz starting 02/13/2007 after PCP had tried multiple medications finding mirtazapine from Dr. Candis Schatz to establish immediate improvement in anxiety and over time resolution of recurring depression.  He processes the severity of anxiety and depression in the past when treatment was most acutely needed, and now he makes decisions about whether to continue his maintenance chronic medications based on the suffering and loss of function in the past.  He now requires only occasional as needed lorazepam and takes his mirtazapine 45 mg nightly regularly finding no side effects and only efficacy particularly preventing fear anxiety and the recurrent depression.  He recalls the pattern of relapsing anxiety when not on medication.  Wife currently continues to have back pain limiting her activity and mobility but she cannot sit still either.  He is active in church and has a trip to [place] with a tour from [place] upcoming soon to which he looks forward.  He prepares psychologically for the stress of travel particularly airflight and entering another country.  He finds joy in his 68-year-old granddaughter processing her place in a recent family wedding and all the change that has occurred.  Anxiety  Presents for follow-up visit. Symptoms include depressed mood, excessive worry, insomnia, nervous/anxious behavior, obsessions and shortness of breath. Patient reports no chest pain, compulsions, confusion, decreased concentration, dizziness, dry mouth, feeling of  choking, hyperventilation, impotence, irritability, malaise, muscle tension, nausea, palpitations, panic, restlessness or suicidal ideas. Symptoms occur occasionally. The severity of symptoms is moderate. The patient sleeps 6 hours per night. The quality of sleep is fair. Nighttime awakenings: occasional.   His past medical history is significant for depression and suicide attempts. Compliance with medications is 76-100%.  Depression       The patient presents with depression.  This is a recurrent problem.  The current episode started more than 1 year ago.   The onset quality is sudden.   The problem occurs intermittently.  The most recent episode lasted 3 hours.    The problem has been gradually improving since onset.  Associated symptoms include helplessness, insomnia, appetite change, body aches and indigestion.  Associated symptoms include no decreased concentration, no fatigue, no hopelessness, not irritable, no restlessness, no decreased interest, no myalgias, no headaches, not sad and no suicidal ideas.     The symptoms are aggravated by social issues.  Past treatments include other medications.  Compliance with treatment is variable and good.  Past compliance problems include medical issues.  Risk factors include a change in medication usage/dosage, family history of mental illness and history of mental illness.   Past medical history includes hypothyroidism, anxiety, depression, mental health disorder and suicide attempts.     Pertinent negatives include no chronic fatigue syndrome, no chronic pain, no fibromyalgia, no thyroid problem, no chronic illness, no recent illness, no life-threatening condition, no physical disability, no terminal illness, no recent psychiatric admission, no Alzheimer's disease, no brain trauma, no dementia, no bipolar disorder, no eating disorder, no obsessive-compulsive disorder, no post-traumatic stress disorder, no schizophrenia and no head trauma.   Individual Medical  History/ Review  of Systems: Changes? :Yes He still takes Dexilant considers expensive but definitely beneficial for his GERD so having history of goitrous hypothyroidism and urology follow-up for PSA otherwise 4 systems negative.  Allergies: Patient has no known allergies.  Current Medications:  Current Outpatient Medications:  .  LORazepam (ATIVAN) 0.5 MG tablet, Take 0.5 mg by mouth 2 (two) times daily., Disp: , Rfl:  .  mirtazapine (REMERON) 45 MG tablet, Take 1 tablet (45 mg total) by mouth at bedtime., Disp: 90 tablet, Rfl: 3 Medication Side Effects: none  Family Medical/ Social History: Changes? Yes wife continues to have hip pain that no one can clarify or help including orthopedist, neurologist, and rheumatologist.  Patient suspects this is indicating the need for a total hip replacement which the doctors delay but wife is suffering greatly.  Mother had anxiety most of her life and sister similar anxiety.  MENTAL HEALTH EXAM: Strength 5/5 and postural reflexes 0/0 with AIMS equals 0. Blood pressure 114/76, pulse 68, height 6\' 1"  (1.854 m), weight 201 lb (91.2 kg).Body mass index is 26.52 kg/m.  General Appearance: Casual, Guarded and Well Groomed  Eye Contact:  Fair  Speech:  Clear and Coherent  Volume:  Normal  Mood:  Anxious, Dysphoric and Euthymic  Affect:  Constricted and Anxious  Thought Process:  Goal Directed  Orientation:  Full (Time, Place, and Person)  Thought Content: Rumination   Suicidal Thoughts:  No  Homicidal Thoughts:  No  Memory:  Remote;   Good  Judgement:  Good  Insight:  Good  Psychomotor Activity:  Increased  Concentration:  Concentration: Good and Attention Span: Good  Recall:  Good  Fund of Knowledge: Good  Language: Good  Assets:  Desire for Improvement Leisure Time Talents/Skills  ADL's:  Intact  Cognition: WNL  Prognosis:  Fair    DIAGNOSES:    ICD-10-CM   1. Generalized anxiety disorder F41.1 mirtazapine (REMERON) 45 MG tablet  2.  Major depressive disorder, recurrent episode, in full remission ([place]) F33.42 mirtazapine (REMERON) 45 MG tablet    Receiving Psychotherapy: No    RECOMMENDATIONS: Over 50% of the time is spent in counseling and coordination of care particularly establishing capacity for trip to [place] and facilitating in advocating for wife's hip and back pain treatment.  Rarely takes Lorazepam but will take it with him to the [place] as 0.5 mg twice daily if needed.  He continues his mirtazapine prescribed 45 mg nightly as a 90-day supply and 3 refills sent to [facility].  We process options of reducing or discontinuing medications in the future but course of 9 years of treatment with Dr. Candis Schatz plus current assessment determined to continue mirtazapine and lorazepam.  Patient has no warning or risk including for travel or medication use to return in 12 months.   Delight Hoh, MD

## 2018-03-06 DIAGNOSIS — Z23 Encounter for immunization: Secondary | ICD-10-CM | POA: Diagnosis not present

## 2018-04-25 ENCOUNTER — Telehealth: Payer: Self-pay

## 2018-04-25 DIAGNOSIS — F411 Generalized anxiety disorder: Secondary | ICD-10-CM

## 2018-04-25 DIAGNOSIS — F3342 Major depressive disorder, recurrent, in full remission: Secondary | ICD-10-CM

## 2018-04-25 MED ORDER — MIRTAZAPINE 45 MG PO TABS: 45.0000 mg | ORAL_TABLET | Freq: Every day | ORAL | 3 refills | Status: DC

## 2018-04-25 NOTE — Telephone Encounter (Signed)
Received fax from [facility] requesting medication for Mirtazapine be transferred to this pharmacy. Verified with pt and he did pick up a new plan effective 04/18/2018 Will send 90 day supply.

## 2018-04-30 DIAGNOSIS — H40013 Open angle with borderline findings, low risk, bilateral: Secondary | ICD-10-CM | POA: Diagnosis not present

## 2018-06-12 DIAGNOSIS — R972 Elevated prostate specific antigen [PSA]: Secondary | ICD-10-CM | POA: Diagnosis not present

## 2018-06-12 DIAGNOSIS — N400 Enlarged prostate without lower urinary tract symptoms: Secondary | ICD-10-CM | POA: Diagnosis not present

## 2018-06-24 IMAGING — US US THYROID
1 series · 12 of 25 positions shown · non-contrast
Comparison: 05/23/2006;

CLINICAL DATA: Goiter. History of left-sided thyroid nodule biopsy
performed in 06/15/2006

EXAM:
THYROID ULTRASOUND
TECHNIQUE: Ultrasound examination of the thyroid gland and adjacent soft
tissues was performed.

[Series 1: us thyroid · 0.08mm/px · 12 of 67 slices shown]
[im 3/67]
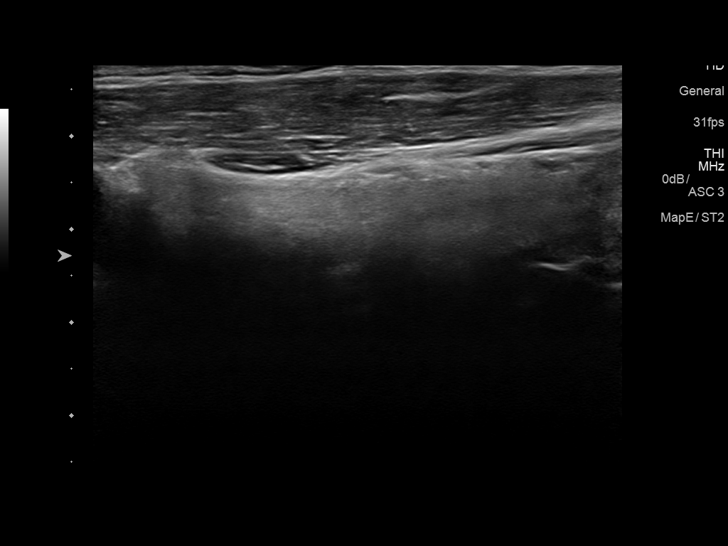
[im 9/67]
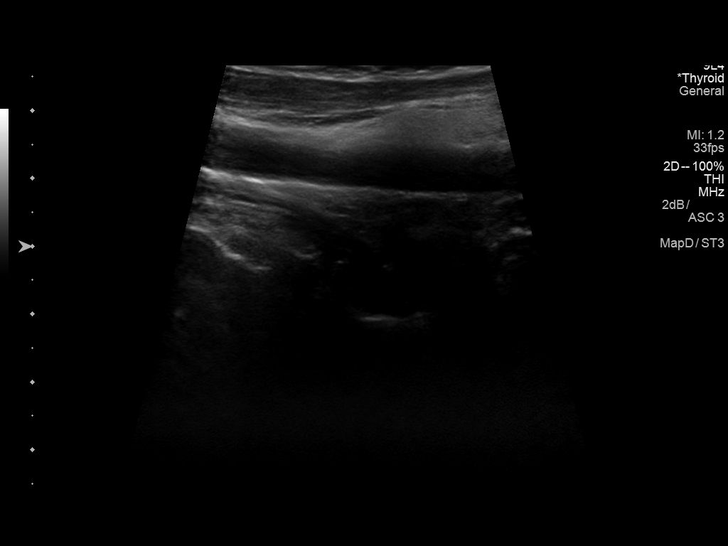
[im 14/67]
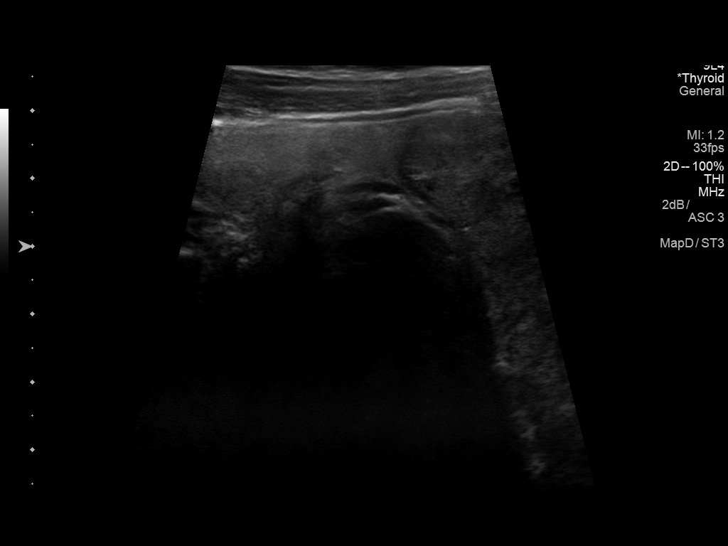
[im 20/67]
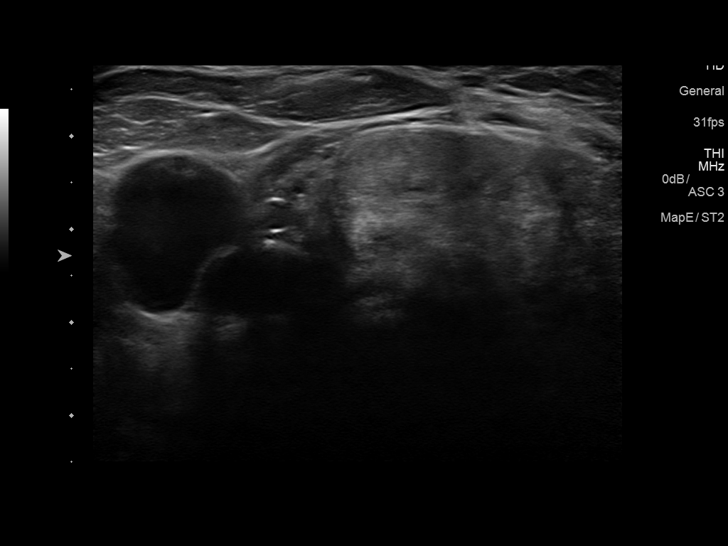
[im 25/67]
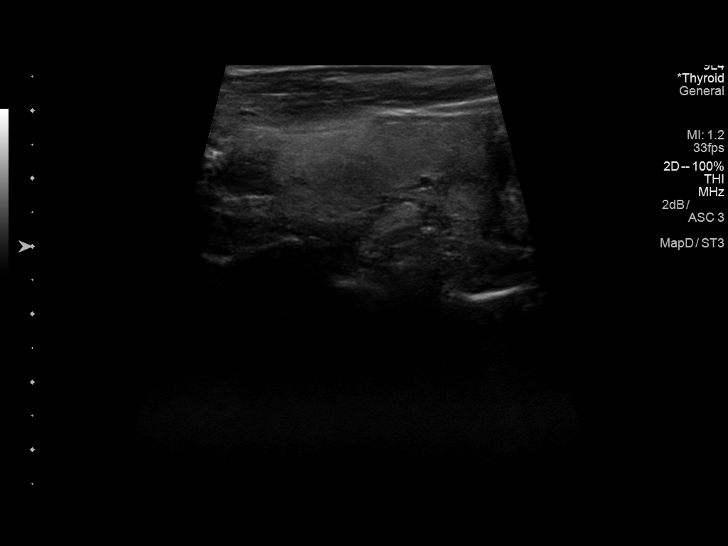
[im 31/67]
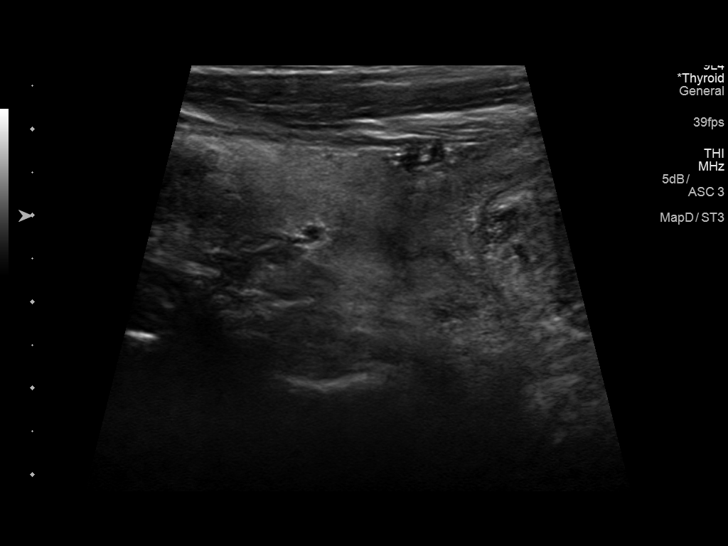
[im 36/67]
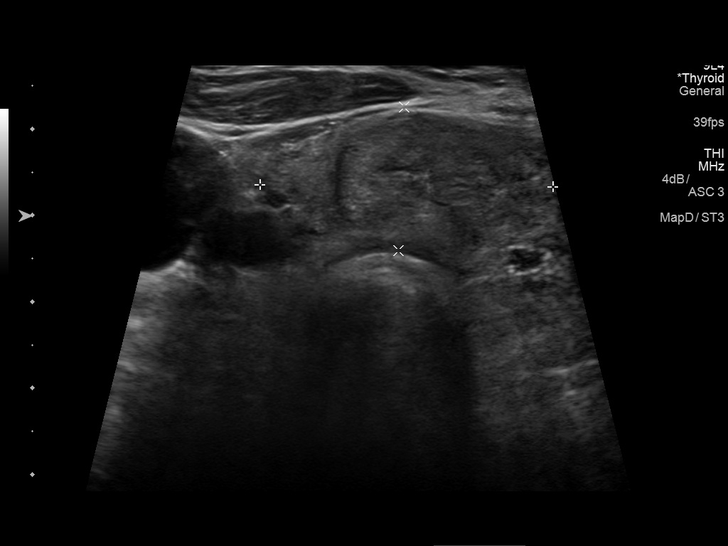
[im 42/67]
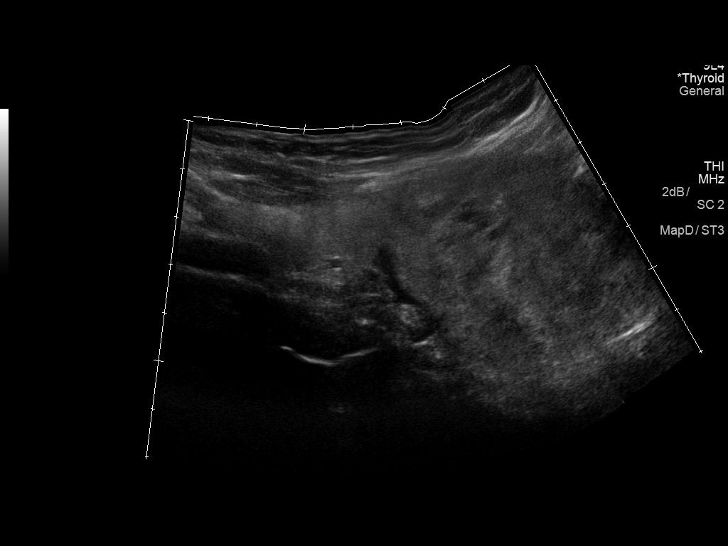
[im 47/67]
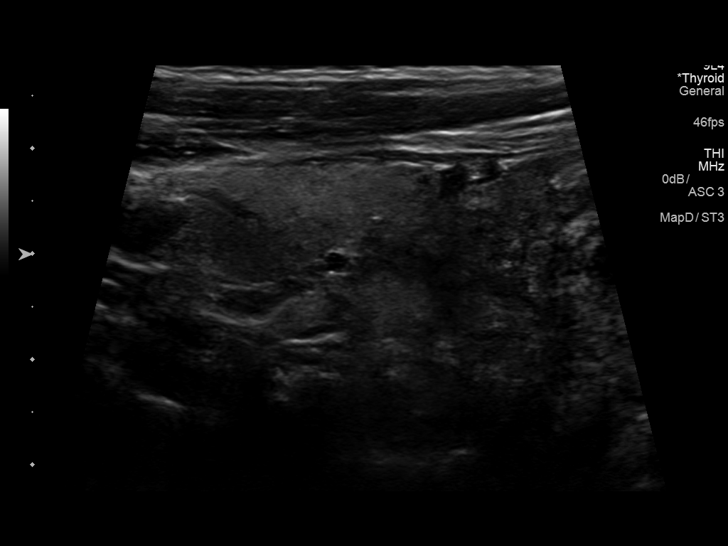
[im 53/67]
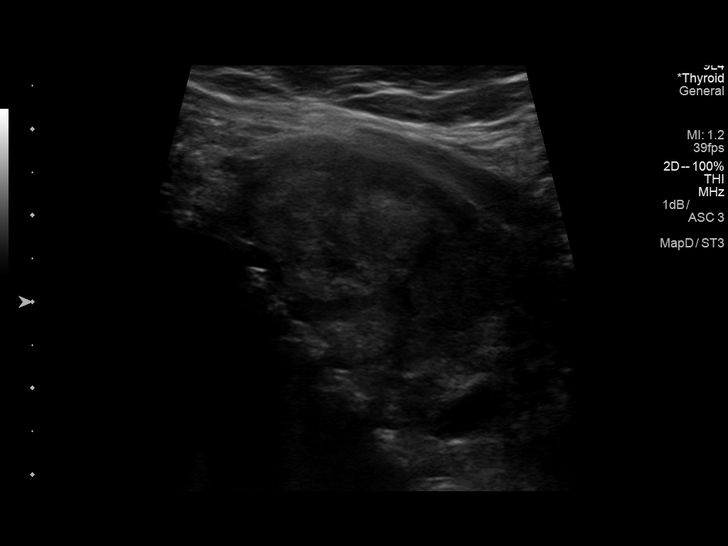
[im 58/67]
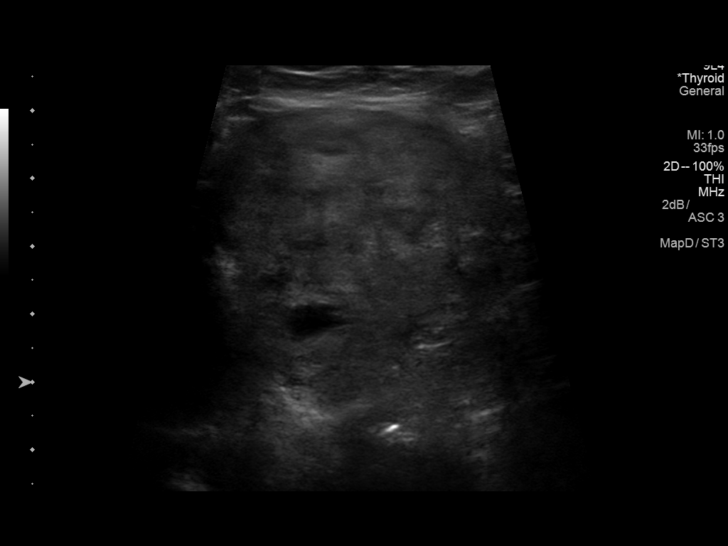
[im 64/67]
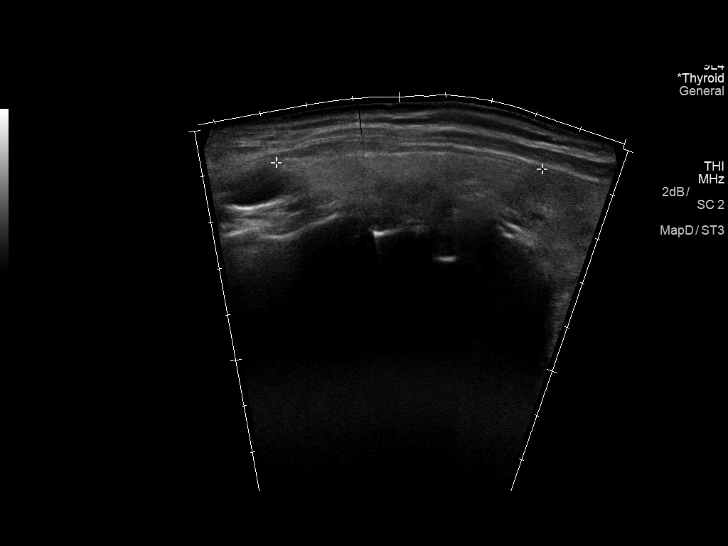

[12 of 25 positions shown; findings below may reference images not displayed]

ultrasound guided left-sided thyroid nodule
mass biopsy-06/15/2006; chest CT - 01/02/2007
FINDINGS: Parenchymal Echotexture: Normal

Isthmus: Normal in size measuring 0.6 cm in diameter, unchanged

Right lobe: Enlarged measuring 7.5 x 1.7 x 3.4 cm, potentially
minimally increased since the remote prior examination, from
previously, 5.8 x 1.9 x 1.2 cm with slight differences potentially
attributable to scan plane projection.

Left lobe: Enlarged measuring 9.8 x 4.6 x 4.8 cm, grossly unchanged
from previously, 9.0 x 2.6 x 1.6 cm.

_________________________________________________________

Estimated total number of nodules >/= 1 cm: 2

Number of spongiform nodules >/=  2 cm not described below (TR1): 0

Number of mixed cystic and solid nodules >/= 1.5 cm not described
below (TR2): 0

_________________________________________________________

The previously biopsied approximately 6.1 x 4.6 x 4.8 cm mass nearly
replacing the left lobe of the thyroid appears grossly unchanged
compared to remote examination performed 05/23/2006, previously,
x 2.6 x 5.1 cm with slight averages likely trouble to scan plane
projection and secondary to the ill-defined borders of the
nodule/mass. Correlation prior biopsy results is recommended.

There is a punctate (approximately 0.5 cm) nodule within the
superior pole the left lobe of the thyroid, not definitely seen on
the remote prior examination that does not meet imaging criteria to
recommend percutaneous sampling or dedicated follow-up

Additional punctate (approximately 0.9 cm) isoechoic nodule seen
within the superior pole of the right lobe of the thyroid unrelated
examination performed [DATE] is not definitely seen on the prior
examination in this favored to have represented a pseudo nodule.

_________________________________________________________

Nodule # 1:

Location: Right; Inferior- this nodule was not definitely seen on
the prior examination.

Maximum size: 3.6 cm; Other 2 dimensions: 2.7 x 2.4 cm

Composition: solid/almost completely solid (2)

Echogenicity: isoechoic (1)

Shape: not taller-than-wide (0)

Margins: ill-defined (0)

Echogenic foci: none (0)

ACR TI-RADS total points: 3.

ACR TI-RADS risk category: TR3 (3 points).

ACR TI-RADS recommendations:

**Given size (>/= 2.5 cm) and appearance, fine needle aspiration of
this mildly suspicious nodule should be considered based on TI-RADS
criteria.

_________________________________________________________
IMPRESSION: 1. Findings suggestive of multinodular goiter.
2. Apparent development of an approximately 3.6 cm nodule/pseudo
nodule within the inferior-medial aspect of the right lobe of the
thyroid (nodule #1), not definitely seen on remote examination
performed in 6224. As such, this nodule meets imaging criteria to
recommend percutaneous sampling as clinically indicated.
3. Previously biopsied approximately 6.1 cm nodule/mass replacing
the entirety of the left lobe of the thyroid appears similar to the
2,008 examination. Correlation with prior biopsy results is
recommended. Assuming benign pathologic diagnosis, this nodule does
not meet imaging criteria to recommend percutaneous sampling or
continued dedicated follow-up.

The above is in keeping with the ACR TI-RADS recommendations - [HOSPITAL] 9818;[DATE].

## 2018-07-18 DIAGNOSIS — K7469 Other cirrhosis of liver: Secondary | ICD-10-CM | POA: Diagnosis not present

## 2018-07-18 DIAGNOSIS — Z1231 Encounter for screening mammogram for malignant neoplasm of breast: Secondary | ICD-10-CM | POA: Diagnosis not present

## 2018-07-18 DIAGNOSIS — M8580 Other specified disorders of bone density and structure, unspecified site: Secondary | ICD-10-CM | POA: Diagnosis not present

## 2018-07-18 DIAGNOSIS — E041 Nontoxic single thyroid nodule: Secondary | ICD-10-CM | POA: Diagnosis not present

## 2018-07-18 DIAGNOSIS — E039 Hypothyroidism, unspecified: Secondary | ICD-10-CM | POA: Diagnosis not present

## 2018-07-20 ENCOUNTER — Other Ambulatory Visit: Payer: Self-pay | Admitting: Internal Medicine

## 2018-07-20 DIAGNOSIS — E049 Nontoxic goiter, unspecified: Secondary | ICD-10-CM

## 2018-09-07 ENCOUNTER — Other Ambulatory Visit: Payer: Medicare HMO

## 2018-09-11 ENCOUNTER — Other Ambulatory Visit: Payer: Medicare HMO

## 2018-09-17 ENCOUNTER — Other Ambulatory Visit: Payer: Medicare HMO

## 2018-09-17 DIAGNOSIS — T148XXA Other injury of unspecified body region, initial encounter: Secondary | ICD-10-CM | POA: Diagnosis not present

## 2018-09-21 ENCOUNTER — Other Ambulatory Visit: Payer: Medicare HMO

## 2018-09-26 ENCOUNTER — Other Ambulatory Visit: Payer: Medicare HMO

## 2018-10-08 ENCOUNTER — Ambulatory Visit
Admission: RE | Admit: 2018-10-08 | Discharge: 2018-10-08 | Disposition: A | Discharge status: Home / Self Care | Payer: PPO | Source: Ambulatory Visit | Attending: Internal Medicine | Admitting: Internal Medicine

## 2018-10-08 ENCOUNTER — Other Ambulatory Visit: Payer: Self-pay

## 2018-10-08 DIAGNOSIS — E049 Nontoxic goiter, unspecified: Secondary | ICD-10-CM

## 2018-10-08 DIAGNOSIS — E041 Nontoxic single thyroid nodule: Secondary | ICD-10-CM | POA: Diagnosis not present

## 2018-10-10 DIAGNOSIS — Z6826 Body mass index (BMI) 26.0-26.9, adult: Secondary | ICD-10-CM | POA: Diagnosis not present

## 2018-10-10 DIAGNOSIS — E041 Nontoxic single thyroid nodule: Secondary | ICD-10-CM | POA: Diagnosis not present

## 2018-10-10 DIAGNOSIS — E039 Hypothyroidism, unspecified: Secondary | ICD-10-CM | POA: Diagnosis not present

## 2018-10-10 DIAGNOSIS — Z7189 Other specified counseling: Secondary | ICD-10-CM | POA: Diagnosis not present

## 2018-10-10 DIAGNOSIS — E042 Nontoxic multinodular goiter: Secondary | ICD-10-CM | POA: Diagnosis not present

## 2018-10-10 DIAGNOSIS — M8580 Other specified disorders of bone density and structure, unspecified site: Secondary | ICD-10-CM | POA: Diagnosis not present

## 2018-10-17 DIAGNOSIS — R51 Headache: Secondary | ICD-10-CM | POA: Diagnosis not present

## 2018-10-17 DIAGNOSIS — J0100 Acute maxillary sinusitis, unspecified: Secondary | ICD-10-CM | POA: Diagnosis not present

## 2018-10-31 DIAGNOSIS — H2513 Age-related nuclear cataract, bilateral: Secondary | ICD-10-CM | POA: Diagnosis not present

## 2018-10-31 DIAGNOSIS — H25013 Cortical age-related cataract, bilateral: Secondary | ICD-10-CM | POA: Diagnosis not present

## 2018-10-31 DIAGNOSIS — H35033 Hypertensive retinopathy, bilateral: Secondary | ICD-10-CM | POA: Diagnosis not present

## 2018-10-31 DIAGNOSIS — H40013 Open angle with borderline findings, low risk, bilateral: Secondary | ICD-10-CM | POA: Diagnosis not present

## 2019-01-07 DIAGNOSIS — R972 Elevated prostate specific antigen [PSA]: Secondary | ICD-10-CM | POA: Diagnosis not present

## 2019-01-07 DIAGNOSIS — Z0000 Encounter for general adult medical examination without abnormal findings: Secondary | ICD-10-CM | POA: Diagnosis not present

## 2019-01-07 DIAGNOSIS — Z13220 Encounter for screening for lipoid disorders: Secondary | ICD-10-CM | POA: Diagnosis not present

## 2019-01-11 DIAGNOSIS — Z9989 Dependence on other enabling machines and devices: Secondary | ICD-10-CM | POA: Diagnosis not present

## 2019-01-11 DIAGNOSIS — J387 Other diseases of larynx: Secondary | ICD-10-CM | POA: Diagnosis not present

## 2019-01-11 DIAGNOSIS — G4733 Obstructive sleep apnea (adult) (pediatric): Secondary | ICD-10-CM | POA: Diagnosis not present

## 2019-01-11 DIAGNOSIS — E049 Nontoxic goiter, unspecified: Secondary | ICD-10-CM | POA: Diagnosis not present

## 2019-01-11 DIAGNOSIS — F419 Anxiety disorder, unspecified: Secondary | ICD-10-CM | POA: Diagnosis not present

## 2019-01-11 DIAGNOSIS — Z23 Encounter for immunization: Secondary | ICD-10-CM | POA: Diagnosis not present

## 2019-01-11 DIAGNOSIS — F5104 Psychophysiologic insomnia: Secondary | ICD-10-CM | POA: Diagnosis not present

## 2019-01-11 DIAGNOSIS — N486 Induration penis plastica: Secondary | ICD-10-CM | POA: Diagnosis not present

## 2019-01-11 DIAGNOSIS — N183 Chronic kidney disease, stage 3 (moderate): Secondary | ICD-10-CM | POA: Diagnosis not present

## 2019-01-11 DIAGNOSIS — N401 Enlarged prostate with lower urinary tract symptoms: Secondary | ICD-10-CM | POA: Diagnosis not present

## 2019-01-11 DIAGNOSIS — Z0000 Encounter for general adult medical examination without abnormal findings: Secondary | ICD-10-CM | POA: Diagnosis not present

## 2019-01-11 DIAGNOSIS — E039 Hypothyroidism, unspecified: Secondary | ICD-10-CM | POA: Diagnosis not present

## 2019-03-06 ENCOUNTER — Encounter: Payer: Self-pay | Admitting: Psychiatry

## 2019-03-06 ENCOUNTER — Other Ambulatory Visit: Payer: Self-pay

## 2019-03-06 ENCOUNTER — Ambulatory Visit (INDEPENDENT_AMBULATORY_CARE_PROVIDER_SITE_OTHER): Payer: PPO | Admitting: Psychiatry

## 2019-03-06 VITALS — Ht 73.0 in | Wt 200.0 lb

## 2019-03-06 DIAGNOSIS — F411 Generalized anxiety disorder: Secondary | ICD-10-CM | POA: Diagnosis not present

## 2019-03-06 DIAGNOSIS — F3342 Major depressive disorder, recurrent, in full remission: Secondary | ICD-10-CM

## 2019-03-06 MED ORDER — LORAZEPAM 0.5 MG PO TABS: 0.5000 mg | ORAL_TABLET | Freq: Two times a day (BID) | ORAL | 0 refills | Status: AC | PRN

## 2019-03-06 NOTE — Progress Notes (Signed)
[facility]  Patient ID: Robert Cline,  MRN: [id number]  PCP: Deland Pretty, MD  Date of Evaluation: 03/06/2019 Time spent:20 minutes from 0905 to 0925  Chief Complaint:  Chief Complaint    Anxiety; Depression      HISTORY/CURRENT STATUS: Robert Cline is seen onsite in office 20 min face-to-face with consent individually with epic collateral for psychiatric interview and exam and 1 year evaluation and management of generalized anxiety and remitted major depression. For the first appointment with me in 4 years after 8 years of care with Dr. Candis Schatz, the patient is confident and comfortable today for the first time. He recalls his first anxiety with prepanic occurring in [place] on vacation in the past. He has now been to [place] for 13 days last winter and tolerated the trip very well with no anxiety attacks or other obstacles. He deals with wife's arthritis and she is doing better as well. Therefore his depression and anxiety are much improved. He is playing golf frequently and still devoted to church. He has no mania, suicidality, psychosis, or delirium.   Anxiety  Presents for follow-up visit. Symptoms include episodically briefly sad, excessive worry, nervous/anxious behavior, and shortness of breath. Patient reports no chest pain, obsessive rumination, decreased concentration, dizziness, dry mouth, feeling of choking, hyperventilation, impotence, irritability, malaise, muscle tension, nausea, palpitations, compulsions, confusion, insomnia, panic, restlessness or suicidal ideas. Symptoms occur occasionally. The severity of symptoms is moderate. The patient sleeps 6 hours per night. The quality  of sleep is fair. Nighttime awakenings: occasional.   Individual Medical History/ Review of Systems: Changes? :Yes Past medications include Effexor XR, Zoloft, and Lexapro, though now since October 2008 12 years ago he has been on Remeron titrated up from 30 to 45 mg nightly in March 2016.   Ativan 0.5 mg is as needed infrequently and [place] registry documenting last dispensing 07/16/2017.  Allergies: Patient has no known allergies.  Current Medications:  Current Outpatient Medications:  .  LORazepam (ATIVAN) 0.5 MG tablet, Take 1 tablet (0.5 mg total) by mouth 2 (two) times daily as needed for anxiety., Disp: 60 tablet, Rfl: 0 .  mirtazapine (REMERON) 45 MG tablet, Take 0.5 tablets (22.5 mg total) by mouth at bedtime., Disp: 90 tablet, Rfl: 3 Medication Side Effects: none  Family Medical/ Social History: Changes? No though mother had anxiety most of her life and sister had similar anxiety.  MENTAL HEALTH EXAM:  Height 6\' 1"  (1.854 m), weight 200 lb (90.7 kg).Body mass index is 26.39 kg/m. Muscle strengths and tone 5/5, postural reflexes and gait 0/0, and AIMS = 0 otherwise deferred for coronavirus shutdown  General Appearance: Casual, Fairly Groomed and Meticulous  Eye Contact:  Fair  Speech:  Clear and Coherent, Garbled and Talkative  Volume:  Normal  Mood:  Anxious and Euthymic  Affect:  Full Range and Anxious  Thought Process:  Coherent, Goal Directed, Irrelevant and Descriptions of Associations: Tangential  Orientation:  Full (Time, Place, and Person)  Thought Content: Rumination and Tangential   Suicidal Thoughts:  No  Homicidal Thoughts:  No  Memory:  Immediate;   Good Remote;   Good  Judgement:  Good  Insight:  Fair  Psychomotor Activity:  Normal and Mannerisms  Concentration:  Concentration: Good and Attention Span: Good  Recall:  Good  Fund of Knowledge: Good  Language: Good  Assets:  Desire for Improvement Leisure Time Resilience Talents/Skills  ADL's:  Intact  Cognition: WNL  Prognosis:  Good    DIAGNOSES:  ICD-10-CM   1. Generalized anxiety disorder  F41.1 LORazepam (ATIVAN) 0.5 MG tablet    mirtazapine (REMERON) 45 MG tablet  2. Major depressive disorder, recurrent episode, in full remission ([place])  F33.42 mirtazapine (REMERON) 45 MG tablet     Receiving Psychotherapy: No    RECOMMENDATIONS: Over 50% of the 20-minute face-to-face time is spent for a total of 10 minutes in counseling and coordination of care for exposure desensitization thought stopping habit reversal response prevention.  We address issues of wife's health, family travel, past stressors, and confidence and comfort for the future as symptom treatment matching loading for medications concludes to reduce Remeron by 50% while continuing Ativan as needed especially with the reduction.  He is E scribed Ativan 0.5 mg twice daily as needed #60 with no refill sent to [facility] for generalized anxiety.  He has current supply of Remeron 45 mg tablets reducing the dose to 1/2 tablet total 22.5 mg every bedtime and we addressed how to request a refill if needed having current  that may last for depression and generalized anxiety.  He returns for follow-up in 1 year being educated on prevention and monitoring and safety hygiene.    Delight Hoh, MD

## 2019-05-20 DIAGNOSIS — L638 Other alopecia areata: Secondary | ICD-10-CM | POA: Diagnosis not present

## 2019-05-20 DIAGNOSIS — L570 Actinic keratosis: Secondary | ICD-10-CM | POA: Diagnosis not present

## 2019-05-20 DIAGNOSIS — X32XXXA Exposure to sunlight, initial encounter: Secondary | ICD-10-CM | POA: Diagnosis not present

## 2019-05-20 DIAGNOSIS — L820 Inflamed seborrheic keratosis: Secondary | ICD-10-CM | POA: Diagnosis not present

## 2019-05-20 DIAGNOSIS — L218 Other seborrheic dermatitis: Secondary | ICD-10-CM | POA: Diagnosis not present

## 2019-06-05 DIAGNOSIS — R972 Elevated prostate specific antigen [PSA]: Secondary | ICD-10-CM | POA: Diagnosis not present

## 2019-06-12 DIAGNOSIS — R972 Elevated prostate specific antigen [PSA]: Secondary | ICD-10-CM | POA: Diagnosis not present

## 2019-06-12 DIAGNOSIS — N401 Enlarged prostate with lower urinary tract symptoms: Secondary | ICD-10-CM | POA: Diagnosis not present

## 2019-06-12 DIAGNOSIS — R351 Nocturia: Secondary | ICD-10-CM | POA: Diagnosis not present

## 2019-06-17 DIAGNOSIS — L820 Inflamed seborrheic keratosis: Secondary | ICD-10-CM | POA: Diagnosis not present

## 2019-06-17 DIAGNOSIS — L218 Other seborrheic dermatitis: Secondary | ICD-10-CM | POA: Diagnosis not present

## 2019-08-07 DIAGNOSIS — N410 Acute prostatitis: Secondary | ICD-10-CM | POA: Diagnosis not present

## 2019-08-26 DIAGNOSIS — N401 Enlarged prostate with lower urinary tract symptoms: Secondary | ICD-10-CM | POA: Diagnosis not present

## 2019-08-26 DIAGNOSIS — R351 Nocturia: Secondary | ICD-10-CM | POA: Diagnosis not present

## 2019-08-26 DIAGNOSIS — R3915 Urgency of urination: Secondary | ICD-10-CM | POA: Diagnosis not present

## 2019-10-14 DIAGNOSIS — M8580 Other specified disorders of bone density and structure, unspecified site: Secondary | ICD-10-CM | POA: Diagnosis not present

## 2019-10-14 DIAGNOSIS — E039 Hypothyroidism, unspecified: Secondary | ICD-10-CM | POA: Diagnosis not present

## 2019-10-16 DIAGNOSIS — E042 Nontoxic multinodular goiter: Secondary | ICD-10-CM | POA: Diagnosis not present

## 2019-10-16 DIAGNOSIS — E041 Nontoxic single thyroid nodule: Secondary | ICD-10-CM | POA: Diagnosis not present

## 2019-10-16 DIAGNOSIS — Z7189 Other specified counseling: Secondary | ICD-10-CM | POA: Diagnosis not present

## 2019-10-16 DIAGNOSIS — E039 Hypothyroidism, unspecified: Secondary | ICD-10-CM | POA: Diagnosis not present

## 2019-10-16 DIAGNOSIS — M8580 Other specified disorders of bone density and structure, unspecified site: Secondary | ICD-10-CM | POA: Diagnosis not present

## 2019-10-16 DIAGNOSIS — Z6826 Body mass index (BMI) 26.0-26.9, adult: Secondary | ICD-10-CM | POA: Diagnosis not present

## 2019-11-01 DIAGNOSIS — H25013 Cortical age-related cataract, bilateral: Secondary | ICD-10-CM | POA: Diagnosis not present

## 2019-11-01 DIAGNOSIS — H35373 Puckering of macula, bilateral: Secondary | ICD-10-CM | POA: Diagnosis not present

## 2019-11-01 DIAGNOSIS — H40013 Open angle with borderline findings, low risk, bilateral: Secondary | ICD-10-CM | POA: Diagnosis not present

## 2019-11-01 DIAGNOSIS — H2513 Age-related nuclear cataract, bilateral: Secondary | ICD-10-CM | POA: Diagnosis not present

## 2019-11-18 DIAGNOSIS — K429 Umbilical hernia without obstruction or gangrene: Secondary | ICD-10-CM | POA: Diagnosis not present

## 2019-12-18 DIAGNOSIS — U071 COVID-19: Secondary | ICD-10-CM | POA: Diagnosis not present

## 2019-12-18 DIAGNOSIS — R52 Pain, unspecified: Secondary | ICD-10-CM | POA: Diagnosis not present

## 2020-01-10 DIAGNOSIS — Z125 Encounter for screening for malignant neoplasm of prostate: Secondary | ICD-10-CM | POA: Diagnosis not present

## 2020-01-10 DIAGNOSIS — N183 Chronic kidney disease, stage 3 unspecified: Secondary | ICD-10-CM | POA: Diagnosis not present

## 2020-01-10 DIAGNOSIS — D72819 Decreased white blood cell count, unspecified: Secondary | ICD-10-CM | POA: Diagnosis not present

## 2020-01-17 DIAGNOSIS — D72819 Decreased white blood cell count, unspecified: Secondary | ICD-10-CM | POA: Diagnosis not present

## 2020-01-17 DIAGNOSIS — N401 Enlarged prostate with lower urinary tract symptoms: Secondary | ICD-10-CM | POA: Diagnosis not present

## 2020-01-17 DIAGNOSIS — M85859 Other specified disorders of bone density and structure, unspecified thigh: Secondary | ICD-10-CM | POA: Diagnosis not present

## 2020-01-17 DIAGNOSIS — N1832 Chronic kidney disease, stage 3b: Secondary | ICD-10-CM | POA: Diagnosis not present

## 2020-01-17 DIAGNOSIS — J387 Other diseases of larynx: Secondary | ICD-10-CM | POA: Diagnosis not present

## 2020-01-17 DIAGNOSIS — F419 Anxiety disorder, unspecified: Secondary | ICD-10-CM | POA: Diagnosis not present

## 2020-01-17 DIAGNOSIS — G4733 Obstructive sleep apnea (adult) (pediatric): Secondary | ICD-10-CM | POA: Diagnosis not present

## 2020-01-17 DIAGNOSIS — Z8616 Personal history of COVID-19: Secondary | ICD-10-CM | POA: Diagnosis not present

## 2020-01-17 DIAGNOSIS — Z1159 Encounter for screening for other viral diseases: Secondary | ICD-10-CM | POA: Diagnosis not present

## 2020-01-17 DIAGNOSIS — R972 Elevated prostate specific antigen [PSA]: Secondary | ICD-10-CM | POA: Diagnosis not present

## 2020-01-17 DIAGNOSIS — E039 Hypothyroidism, unspecified: Secondary | ICD-10-CM | POA: Diagnosis not present

## 2020-01-17 DIAGNOSIS — F321 Major depressive disorder, single episode, moderate: Secondary | ICD-10-CM | POA: Diagnosis not present

## 2020-01-17 DIAGNOSIS — Z0000 Encounter for general adult medical examination without abnormal findings: Secondary | ICD-10-CM | POA: Diagnosis not present

## 2020-02-06 ENCOUNTER — Encounter: Payer: Self-pay | Admitting: Psychiatry

## 2020-03-04 ENCOUNTER — Ambulatory Visit: Payer: PPO | Admitting: Psychiatry

## 2020-03-04 DIAGNOSIS — M810 Age-related osteoporosis without current pathological fracture: Secondary | ICD-10-CM | POA: Diagnosis not present

## 2020-04-03 DIAGNOSIS — M810 Age-related osteoporosis without current pathological fracture: Secondary | ICD-10-CM | POA: Diagnosis not present

## 2020-04-03 DIAGNOSIS — F419 Anxiety disorder, unspecified: Secondary | ICD-10-CM | POA: Diagnosis not present

## 2020-04-22 DIAGNOSIS — N401 Enlarged prostate with lower urinary tract symptoms: Secondary | ICD-10-CM | POA: Diagnosis not present

## 2020-04-22 DIAGNOSIS — F411 Generalized anxiety disorder: Secondary | ICD-10-CM | POA: Diagnosis not present

## 2020-06-10 DIAGNOSIS — M810 Age-related osteoporosis without current pathological fracture: Secondary | ICD-10-CM | POA: Diagnosis not present

## 2020-06-16 DIAGNOSIS — J069 Acute upper respiratory infection, unspecified: Secondary | ICD-10-CM | POA: Diagnosis not present

## 2020-06-24 DIAGNOSIS — L650 Telogen effluvium: Secondary | ICD-10-CM | POA: Diagnosis not present

## 2020-07-15 DIAGNOSIS — L821 Other seborrheic keratosis: Secondary | ICD-10-CM | POA: Diagnosis not present

## 2020-07-15 DIAGNOSIS — L650 Telogen effluvium: Secondary | ICD-10-CM | POA: Diagnosis not present

## 2020-07-15 DIAGNOSIS — L570 Actinic keratosis: Secondary | ICD-10-CM | POA: Diagnosis not present

## 2020-07-15 DIAGNOSIS — D485 Neoplasm of uncertain behavior of skin: Secondary | ICD-10-CM | POA: Diagnosis not present

## 2020-07-15 DIAGNOSIS — D1801 Hemangioma of skin and subcutaneous tissue: Secondary | ICD-10-CM | POA: Diagnosis not present

## 2020-07-15 DIAGNOSIS — L814 Other melanin hyperpigmentation: Secondary | ICD-10-CM | POA: Diagnosis not present

## 2020-10-14 DIAGNOSIS — E041 Nontoxic single thyroid nodule: Secondary | ICD-10-CM | POA: Diagnosis not present

## 2020-10-15 DIAGNOSIS — R972 Elevated prostate specific antigen [PSA]: Secondary | ICD-10-CM | POA: Diagnosis not present

## 2020-10-21 DIAGNOSIS — Z6826 Body mass index (BMI) 26.0-26.9, adult: Secondary | ICD-10-CM | POA: Diagnosis not present

## 2020-10-21 DIAGNOSIS — E041 Nontoxic single thyroid nodule: Secondary | ICD-10-CM | POA: Diagnosis not present

## 2020-10-21 DIAGNOSIS — E042 Nontoxic multinodular goiter: Secondary | ICD-10-CM | POA: Diagnosis not present

## 2020-10-21 DIAGNOSIS — M8580 Other specified disorders of bone density and structure, unspecified site: Secondary | ICD-10-CM | POA: Diagnosis not present

## 2020-10-23 DIAGNOSIS — N401 Enlarged prostate with lower urinary tract symptoms: Secondary | ICD-10-CM | POA: Diagnosis not present

## 2020-10-23 DIAGNOSIS — R351 Nocturia: Secondary | ICD-10-CM | POA: Diagnosis not present

## 2020-10-23 DIAGNOSIS — R972 Elevated prostate specific antigen [PSA]: Secondary | ICD-10-CM | POA: Diagnosis not present

## 2020-11-02 DIAGNOSIS — H2513 Age-related nuclear cataract, bilateral: Secondary | ICD-10-CM | POA: Diagnosis not present

## 2020-11-02 DIAGNOSIS — H40013 Open angle with borderline findings, low risk, bilateral: Secondary | ICD-10-CM | POA: Diagnosis not present

## 2020-11-02 DIAGNOSIS — H524 Presbyopia: Secondary | ICD-10-CM | POA: Diagnosis not present

## 2020-11-02 DIAGNOSIS — H35373 Puckering of macula, bilateral: Secondary | ICD-10-CM | POA: Diagnosis not present

## 2020-11-02 DIAGNOSIS — H25013 Cortical age-related cataract, bilateral: Secondary | ICD-10-CM | POA: Diagnosis not present

## 2020-11-10 ENCOUNTER — Other Ambulatory Visit: Payer: Self-pay | Admitting: Internal Medicine

## 2020-11-10 DIAGNOSIS — E042 Nontoxic multinodular goiter: Secondary | ICD-10-CM

## 2020-11-10 DIAGNOSIS — E041 Nontoxic single thyroid nodule: Secondary | ICD-10-CM

## 2020-11-20 ENCOUNTER — Other Ambulatory Visit: Payer: PPO

## 2020-11-30 ENCOUNTER — Ambulatory Visit
Admission: RE | Admit: 2020-11-30 | Discharge: 2020-11-30 | Disposition: A | Discharge status: Home / Self Care | Payer: PPO | Source: Ambulatory Visit | Attending: Internal Medicine | Admitting: Internal Medicine

## 2020-11-30 ENCOUNTER — Other Ambulatory Visit: Payer: Self-pay

## 2020-11-30 DIAGNOSIS — E042 Nontoxic multinodular goiter: Secondary | ICD-10-CM

## 2020-11-30 DIAGNOSIS — E041 Nontoxic single thyroid nodule: Secondary | ICD-10-CM | POA: Diagnosis not present

## 2021-01-15 DIAGNOSIS — M810 Age-related osteoporosis without current pathological fracture: Secondary | ICD-10-CM | POA: Diagnosis not present

## 2021-01-15 DIAGNOSIS — N1832 Chronic kidney disease, stage 3b: Secondary | ICD-10-CM | POA: Diagnosis not present

## 2021-01-15 DIAGNOSIS — Z125 Encounter for screening for malignant neoplasm of prostate: Secondary | ICD-10-CM | POA: Diagnosis not present

## 2021-01-15 DIAGNOSIS — E785 Hyperlipidemia, unspecified: Secondary | ICD-10-CM | POA: Diagnosis not present

## 2021-01-15 DIAGNOSIS — Z13220 Encounter for screening for lipoid disorders: Secondary | ICD-10-CM | POA: Diagnosis not present

## 2021-01-20 DIAGNOSIS — F5104 Psychophysiologic insomnia: Secondary | ICD-10-CM | POA: Diagnosis not present

## 2021-01-20 DIAGNOSIS — J387 Other diseases of larynx: Secondary | ICD-10-CM | POA: Diagnosis not present

## 2021-01-20 DIAGNOSIS — G4733 Obstructive sleep apnea (adult) (pediatric): Secondary | ICD-10-CM | POA: Diagnosis not present

## 2021-01-20 DIAGNOSIS — M810 Age-related osteoporosis without current pathological fracture: Secondary | ICD-10-CM | POA: Diagnosis not present

## 2021-01-20 DIAGNOSIS — Z0000 Encounter for general adult medical examination without abnormal findings: Secondary | ICD-10-CM | POA: Diagnosis not present

## 2021-01-20 DIAGNOSIS — K429 Umbilical hernia without obstruction or gangrene: Secondary | ICD-10-CM | POA: Diagnosis not present

## 2021-01-20 DIAGNOSIS — F321 Major depressive disorder, single episode, moderate: Secondary | ICD-10-CM | POA: Diagnosis not present

## 2021-01-20 DIAGNOSIS — N401 Enlarged prostate with lower urinary tract symptoms: Secondary | ICD-10-CM | POA: Diagnosis not present

## 2021-02-04 ENCOUNTER — Other Ambulatory Visit: Payer: Self-pay | Admitting: Internal Medicine

## 2021-02-05 ENCOUNTER — Other Ambulatory Visit: Payer: Self-pay | Admitting: Internal Medicine

## 2021-02-05 DIAGNOSIS — Z0000 Encounter for general adult medical examination without abnormal findings: Secondary | ICD-10-CM

## 2021-02-05 DIAGNOSIS — G4733 Obstructive sleep apnea (adult) (pediatric): Secondary | ICD-10-CM

## 2021-02-26 ENCOUNTER — Ambulatory Visit
Admission: RE | Admit: 2021-02-26 | Discharge: 2021-02-26 | Disposition: A | Discharge status: Home / Self Care | Payer: No Typology Code available for payment source | Source: Ambulatory Visit | Attending: Internal Medicine | Admitting: Internal Medicine

## 2021-02-26 DIAGNOSIS — Z0000 Encounter for general adult medical examination without abnormal findings: Secondary | ICD-10-CM

## 2021-02-26 DIAGNOSIS — G4733 Obstructive sleep apnea (adult) (pediatric): Secondary | ICD-10-CM

## 2021-03-24 DIAGNOSIS — K429 Umbilical hernia without obstruction or gangrene: Secondary | ICD-10-CM | POA: Diagnosis not present

## 2021-05-12 DIAGNOSIS — M720 Palmar fascial fibromatosis [Dupuytren]: Secondary | ICD-10-CM | POA: Diagnosis not present

## 2021-05-12 DIAGNOSIS — K429 Umbilical hernia without obstruction or gangrene: Secondary | ICD-10-CM | POA: Diagnosis not present

## 2021-06-14 DIAGNOSIS — M810 Age-related osteoporosis without current pathological fracture: Secondary | ICD-10-CM | POA: Diagnosis not present

## 2021-06-28 DIAGNOSIS — J0100 Acute maxillary sinusitis, unspecified: Secondary | ICD-10-CM | POA: Diagnosis not present

## 2021-06-30 DIAGNOSIS — D122 Benign neoplasm of ascending colon: Secondary | ICD-10-CM | POA: Diagnosis not present

## 2021-06-30 DIAGNOSIS — D123 Benign neoplasm of transverse colon: Secondary | ICD-10-CM | POA: Diagnosis not present

## 2021-06-30 DIAGNOSIS — D124 Benign neoplasm of descending colon: Secondary | ICD-10-CM | POA: Diagnosis not present

## 2021-06-30 DIAGNOSIS — Z86010 Personal history of colonic polyps: Secondary | ICD-10-CM | POA: Diagnosis not present

## 2021-06-30 DIAGNOSIS — K649 Unspecified hemorrhoids: Secondary | ICD-10-CM | POA: Diagnosis not present

## 2021-06-30 DIAGNOSIS — K5730 Diverticulosis of large intestine without perforation or abscess without bleeding: Secondary | ICD-10-CM | POA: Diagnosis not present

## 2021-07-02 DIAGNOSIS — D122 Benign neoplasm of ascending colon: Secondary | ICD-10-CM | POA: Diagnosis not present

## 2021-07-02 DIAGNOSIS — D123 Benign neoplasm of transverse colon: Secondary | ICD-10-CM | POA: Diagnosis not present

## 2021-07-02 DIAGNOSIS — D124 Benign neoplasm of descending colon: Secondary | ICD-10-CM | POA: Diagnosis not present

## 2021-07-15 DIAGNOSIS — L570 Actinic keratosis: Secondary | ICD-10-CM | POA: Diagnosis not present

## 2021-07-15 DIAGNOSIS — L814 Other melanin hyperpigmentation: Secondary | ICD-10-CM | POA: Diagnosis not present

## 2021-07-15 DIAGNOSIS — D1801 Hemangioma of skin and subcutaneous tissue: Secondary | ICD-10-CM | POA: Diagnosis not present

## 2021-07-15 DIAGNOSIS — L821 Other seborrheic keratosis: Secondary | ICD-10-CM | POA: Diagnosis not present

## 2021-10-18 DIAGNOSIS — N189 Chronic kidney disease, unspecified: Secondary | ICD-10-CM | POA: Diagnosis not present

## 2021-10-20 DIAGNOSIS — R972 Elevated prostate specific antigen [PSA]: Secondary | ICD-10-CM | POA: Diagnosis not present

## 2021-10-20 DIAGNOSIS — N189 Chronic kidney disease, unspecified: Secondary | ICD-10-CM | POA: Diagnosis not present

## 2021-10-20 DIAGNOSIS — E041 Nontoxic single thyroid nodule: Secondary | ICD-10-CM | POA: Diagnosis not present

## 2021-10-20 DIAGNOSIS — Z125 Encounter for screening for malignant neoplasm of prostate: Secondary | ICD-10-CM | POA: Diagnosis not present

## 2021-10-20 DIAGNOSIS — D72819 Decreased white blood cell count, unspecified: Secondary | ICD-10-CM | POA: Diagnosis not present

## 2021-10-25 ENCOUNTER — Other Ambulatory Visit: Payer: Self-pay | Admitting: Home Modifications

## 2021-10-25 DIAGNOSIS — E042 Nontoxic multinodular goiter: Secondary | ICD-10-CM

## 2021-10-25 DIAGNOSIS — E039 Hypothyroidism, unspecified: Secondary | ICD-10-CM | POA: Diagnosis not present

## 2021-10-27 ENCOUNTER — Other Ambulatory Visit: Payer: No Typology Code available for payment source

## 2021-10-27 DIAGNOSIS — R972 Elevated prostate specific antigen [PSA]: Secondary | ICD-10-CM | POA: Diagnosis not present

## 2021-11-01 DIAGNOSIS — H524 Presbyopia: Secondary | ICD-10-CM | POA: Diagnosis not present

## 2021-11-01 DIAGNOSIS — R972 Elevated prostate specific antigen [PSA]: Secondary | ICD-10-CM | POA: Diagnosis not present

## 2021-11-01 DIAGNOSIS — H25813 Combined forms of age-related cataract, bilateral: Secondary | ICD-10-CM | POA: Diagnosis not present

## 2021-11-01 DIAGNOSIS — N401 Enlarged prostate with lower urinary tract symptoms: Secondary | ICD-10-CM | POA: Diagnosis not present

## 2021-11-01 DIAGNOSIS — R351 Nocturia: Secondary | ICD-10-CM | POA: Diagnosis not present

## 2021-11-01 DIAGNOSIS — H40013 Open angle with borderline findings, low risk, bilateral: Secondary | ICD-10-CM | POA: Diagnosis not present

## 2022-01-19 DIAGNOSIS — I10 Essential (primary) hypertension: Secondary | ICD-10-CM | POA: Diagnosis not present

## 2022-01-19 DIAGNOSIS — Z0000 Encounter for general adult medical examination without abnormal findings: Secondary | ICD-10-CM | POA: Diagnosis not present

## 2022-01-24 DIAGNOSIS — R972 Elevated prostate specific antigen [PSA]: Secondary | ICD-10-CM | POA: Diagnosis not present

## 2022-01-24 DIAGNOSIS — Z23 Encounter for immunization: Secondary | ICD-10-CM | POA: Diagnosis not present

## 2022-01-24 DIAGNOSIS — Z0000 Encounter for general adult medical examination without abnormal findings: Secondary | ICD-10-CM | POA: Diagnosis not present

## 2022-01-24 DIAGNOSIS — M810 Age-related osteoporosis without current pathological fracture: Secondary | ICD-10-CM | POA: Diagnosis not present

## 2022-01-24 DIAGNOSIS — J387 Other diseases of larynx: Secondary | ICD-10-CM | POA: Diagnosis not present

## 2022-01-24 DIAGNOSIS — N1831 Chronic kidney disease, stage 3a: Secondary | ICD-10-CM | POA: Diagnosis not present

## 2022-01-24 DIAGNOSIS — E039 Hypothyroidism, unspecified: Secondary | ICD-10-CM | POA: Diagnosis not present

## 2022-01-24 DIAGNOSIS — F5104 Psychophysiologic insomnia: Secondary | ICD-10-CM | POA: Diagnosis not present

## 2022-01-24 DIAGNOSIS — D72819 Decreased white blood cell count, unspecified: Secondary | ICD-10-CM | POA: Diagnosis not present

## 2022-01-24 DIAGNOSIS — N401 Enlarged prostate with lower urinary tract symptoms: Secondary | ICD-10-CM | POA: Diagnosis not present

## 2022-01-24 DIAGNOSIS — E049 Nontoxic goiter, unspecified: Secondary | ICD-10-CM | POA: Diagnosis not present

## 2022-01-24 DIAGNOSIS — R292 Abnormal reflex: Secondary | ICD-10-CM | POA: Diagnosis not present

## 2022-04-06 DIAGNOSIS — M810 Age-related osteoporosis without current pathological fracture: Secondary | ICD-10-CM | POA: Diagnosis not present

## 2022-04-21 ENCOUNTER — Ambulatory Visit
Admission: RE | Admit: 2022-04-21 | Discharge: 2022-04-21 | Disposition: A | Discharge status: Home / Self Care | Payer: Self-pay | Source: Ambulatory Visit | Attending: Home Modifications | Admitting: Home Modifications

## 2022-04-21 DIAGNOSIS — E042 Nontoxic multinodular goiter: Secondary | ICD-10-CM

## 2022-04-21 DIAGNOSIS — R221 Localized swelling, mass and lump, neck: Secondary | ICD-10-CM | POA: Diagnosis not present

## 2022-04-21 DIAGNOSIS — E049 Nontoxic goiter, unspecified: Secondary | ICD-10-CM | POA: Diagnosis not present

## 2022-06-15 ENCOUNTER — Telehealth: Payer: Self-pay

## 2022-06-15 DIAGNOSIS — M810 Age-related osteoporosis without current pathological fracture: Secondary | ICD-10-CM | POA: Diagnosis not present

## 2022-06-15 NOTE — Patient Instructions (Signed)
Visit Information  Thank you for taking time to visit with me today. Please don't hesitate to contact me if I can be of assistance to you.   Following are the goals we discussed today:   Goals Addressed             This Visit's Progress    COMPLETED: Care Coordination Activities-No follow up required       Care Coordination Interventions: Advised patient to Annual Wellness exam. Discussed [facility] services and support. Assessed SDOH. Advised to discuss with primary care physician if services needed in the future.  Interventions Today    Flowsheet Row Most Recent Value  Chronic Disease   Chronic disease during today's visit Other  [Osteoporosis]  General Interventions   General Interventions Discussed/Reviewed General Interventions Discussed  Education Interventions   Education Provided Provided Education  Provided Verbal Education On Other  [osteoporosis]  Pharmacy Interventions   Pharmacy Dicussed/Reviewed Pharmacy Topics Discussed  [reclast]              If you are experiencing a Mental Health or [place] or need someone to talk to, please call the Suicide and Crisis Lifeline: 988   The patient verbalized understanding of instructions, educational materials, and care plan provided today and DECLINED offer to receive copy of patient instructions, educational materials, and care plan.   The patient has been provided with contact information for the care management team and has been advised to call with any health related questions or concerns.   Jone Baseman, RN, MSN [facility] Care Management Coordinator Direct Line [phone]

## 2022-06-15 NOTE — Patient Outreach (Signed)
  Care Coordination   In Person Provider Office Visit Note   06/15/2022 Name: Robert Cline MRN: [id number] DOB: 12-Aug-1950  Robert Cline is a 72 y.o. year old male who sees Deland Pretty, MD for primary care. I engaged with Robert Cline in the providers office today.  What matters to the patients health and wellness today?  none    Goals Addressed             This Visit's Progress    COMPLETED: Care Coordination Activities-No follow up required       Care Coordination Interventions: Advised patient to Annual Wellness exam. Discussed [facility] services and support. Assessed SDOH. Advised to discuss with primary care physician if services needed in the future.  Interventions Today    Flowsheet Row Most Recent Value  Chronic Disease   Chronic disease during today's visit Other  [Osteoporosis]  General Interventions   General Interventions Discussed/Reviewed General Interventions Discussed  Education Interventions   Education Provided Provided Education  Provided Verbal Education On Other  [osteoporosis]  Pharmacy Interventions   Pharmacy Dicussed/Reviewed Pharmacy Topics Discussed  [reclast]             SDOH assessments and interventions completed:  Yes  SDOH Interventions Today    Flowsheet Row Most Recent Value  SDOH Interventions   Housing Interventions Intervention Not Indicated  Transportation Interventions Intervention Not Indicated        Care Coordination Interventions:  Yes, provided   Follow up plan: No further intervention required.   Encounter Outcome:  Pt. Visit Completed   Jone Baseman, RN, MSN [facility] Care Management Coordinator Direct Line [phone]

## 2022-07-18 DIAGNOSIS — L821 Other seborrheic keratosis: Secondary | ICD-10-CM | POA: Diagnosis not present

## 2022-07-18 DIAGNOSIS — L570 Actinic keratosis: Secondary | ICD-10-CM | POA: Diagnosis not present

## 2022-07-18 DIAGNOSIS — D485 Neoplasm of uncertain behavior of skin: Secondary | ICD-10-CM | POA: Diagnosis not present

## 2022-07-18 DIAGNOSIS — C44529 Squamous cell carcinoma of skin of other part of trunk: Secondary | ICD-10-CM | POA: Diagnosis not present

## 2022-07-18 DIAGNOSIS — L814 Other melanin hyperpigmentation: Secondary | ICD-10-CM | POA: Diagnosis not present

## 2022-07-18 DIAGNOSIS — D1801 Hemangioma of skin and subcutaneous tissue: Secondary | ICD-10-CM | POA: Diagnosis not present

## 2022-08-10 DIAGNOSIS — C44529 Squamous cell carcinoma of skin of other part of trunk: Secondary | ICD-10-CM | POA: Diagnosis not present

## 2022-09-07 DIAGNOSIS — K429 Umbilical hernia without obstruction or gangrene: Secondary | ICD-10-CM | POA: Diagnosis not present

## 2022-10-26 DIAGNOSIS — E042 Nontoxic multinodular goiter: Secondary | ICD-10-CM | POA: Diagnosis not present

## 2022-11-18 DIAGNOSIS — K429 Umbilical hernia without obstruction or gangrene: Secondary | ICD-10-CM | POA: Diagnosis not present

## 2022-11-24 DIAGNOSIS — R972 Elevated prostate specific antigen [PSA]: Secondary | ICD-10-CM | POA: Diagnosis not present

## 2022-11-30 DIAGNOSIS — H9201 Otalgia, right ear: Secondary | ICD-10-CM | POA: Diagnosis not present

## 2022-12-01 DIAGNOSIS — R972 Elevated prostate specific antigen [PSA]: Secondary | ICD-10-CM | POA: Diagnosis not present

## 2022-12-01 DIAGNOSIS — R351 Nocturia: Secondary | ICD-10-CM | POA: Diagnosis not present

## 2022-12-01 DIAGNOSIS — N401 Enlarged prostate with lower urinary tract symptoms: Secondary | ICD-10-CM | POA: Diagnosis not present

## 2022-12-05 DIAGNOSIS — H66011 Acute suppurative otitis media with spontaneous rupture of ear drum, right ear: Secondary | ICD-10-CM | POA: Diagnosis not present

## 2022-12-05 DIAGNOSIS — Z6826 Body mass index (BMI) 26.0-26.9, adult: Secondary | ICD-10-CM | POA: Diagnosis not present

## 2022-12-06 ENCOUNTER — Other Ambulatory Visit (HOSPITAL_BASED_OUTPATIENT_CLINIC_OR_DEPARTMENT_OTHER): Payer: Self-pay

## 2022-12-06 MED ORDER — TAMSULOSIN HCL 0.4 MG PO CAPS
0.4000 mg | ORAL_CAPSULE | Freq: Every day | ORAL | 3 refills | Status: DC
  Filled 2022-12-23 – 2022-12-24 (×2): qty 90, 90d supply, fill #0
  Filled 2023-03-17: qty 90, 90d supply, fill #1

## 2022-12-06 MED ORDER — MIRTAZAPINE 15 MG PO TABS
15.0000 mg | ORAL_TABLET | Freq: Every day | ORAL | 1 refills | Status: DC
  Filled 2022-12-23: qty 180, 180d supply, fill #0
  Filled 2022-12-24: qty 100, 100d supply, fill #0
  Filled 2023-03-17: qty 80, 80d supply, fill #1

## 2022-12-07 ENCOUNTER — Other Ambulatory Visit (HOSPITAL_BASED_OUTPATIENT_CLINIC_OR_DEPARTMENT_OTHER): Payer: Self-pay

## 2022-12-12 ENCOUNTER — Other Ambulatory Visit (HOSPITAL_BASED_OUTPATIENT_CLINIC_OR_DEPARTMENT_OTHER): Payer: Self-pay

## 2022-12-12 MED ORDER — DEXLANSOPRAZOLE 60 MG PO CPDR
60.0000 mg | DELAYED_RELEASE_CAPSULE | Freq: Every day | ORAL | 3 refills | Status: AC
  Filled 2022-12-12 – 2022-12-13 (×2): qty 90, 90d supply, fill #0

## 2022-12-12 MED ORDER — DEXLANSOPRAZOLE 60 MG PO CPDR
60.0000 mg | DELAYED_RELEASE_CAPSULE | Freq: Every day | ORAL | 3 refills | Status: AC
  Filled 2022-12-12: qty 90, 90d supply, fill #0
  Filled 2023-04-02: qty 10, 10d supply, fill #0
  Filled 2023-04-04: qty 80, 80d supply, fill #0
  Filled 2023-04-04: qty 10, 10d supply, fill #0
  Filled 2023-06-26: qty 30, 30d supply, fill #1
  Filled 2023-08-09: qty 90, 90d supply, fill #2

## 2022-12-13 ENCOUNTER — Other Ambulatory Visit (HOSPITAL_BASED_OUTPATIENT_CLINIC_OR_DEPARTMENT_OTHER): Payer: Self-pay

## 2022-12-14 ENCOUNTER — Other Ambulatory Visit (HOSPITAL_BASED_OUTPATIENT_CLINIC_OR_DEPARTMENT_OTHER): Payer: Self-pay

## 2022-12-14 DIAGNOSIS — Z9889 Other specified postprocedural states: Secondary | ICD-10-CM | POA: Diagnosis not present

## 2022-12-16 ENCOUNTER — Other Ambulatory Visit (HOSPITAL_BASED_OUTPATIENT_CLINIC_OR_DEPARTMENT_OTHER): Payer: Self-pay

## 2022-12-16 ENCOUNTER — Other Ambulatory Visit: Payer: Self-pay

## 2022-12-16 DIAGNOSIS — H60501 Unspecified acute noninfective otitis externa, right ear: Secondary | ICD-10-CM | POA: Diagnosis not present

## 2022-12-16 MED ORDER — CLOTRIMAZOLE 1 % EX SOLN
CUTANEOUS | 1 refills | Status: AC
  Filled 2022-12-16: qty 30, 30d supply, fill #0

## 2022-12-16 MED ORDER — OFLOXACIN 0.3 % OT SOLN
4.0000 [drp] | Freq: Two times a day (BID) | OTIC | 0 refills | Status: DC
  Filled 2022-12-16: qty 5, 13d supply, fill #0

## 2022-12-24 ENCOUNTER — Other Ambulatory Visit (HOSPITAL_BASED_OUTPATIENT_CLINIC_OR_DEPARTMENT_OTHER): Payer: Self-pay

## 2023-01-04 DIAGNOSIS — H60391 Other infective otitis externa, right ear: Secondary | ICD-10-CM | POA: Diagnosis not present

## 2023-01-13 DIAGNOSIS — H25813 Combined forms of age-related cataract, bilateral: Secondary | ICD-10-CM | POA: Diagnosis not present

## 2023-01-13 DIAGNOSIS — H43812 Vitreous degeneration, left eye: Secondary | ICD-10-CM | POA: Diagnosis not present

## 2023-01-13 DIAGNOSIS — H40013 Open angle with borderline findings, low risk, bilateral: Secondary | ICD-10-CM | POA: Diagnosis not present

## 2023-01-13 DIAGNOSIS — H524 Presbyopia: Secondary | ICD-10-CM | POA: Diagnosis not present

## 2023-01-16 ENCOUNTER — Other Ambulatory Visit (HOSPITAL_BASED_OUTPATIENT_CLINIC_OR_DEPARTMENT_OTHER): Payer: Self-pay

## 2023-01-17 DIAGNOSIS — H60501 Unspecified acute noninfective otitis externa, right ear: Secondary | ICD-10-CM | POA: Diagnosis not present

## 2023-01-23 DIAGNOSIS — Z85828 Personal history of other malignant neoplasm of skin: Secondary | ICD-10-CM | POA: Diagnosis not present

## 2023-01-23 DIAGNOSIS — L570 Actinic keratosis: Secondary | ICD-10-CM | POA: Diagnosis not present

## 2023-01-23 DIAGNOSIS — L821 Other seborrheic keratosis: Secondary | ICD-10-CM | POA: Diagnosis not present

## 2023-01-23 DIAGNOSIS — L814 Other melanin hyperpigmentation: Secondary | ICD-10-CM | POA: Diagnosis not present

## 2023-01-23 DIAGNOSIS — C4441 Basal cell carcinoma of skin of scalp and neck: Secondary | ICD-10-CM | POA: Diagnosis not present

## 2023-01-23 DIAGNOSIS — D72819 Decreased white blood cell count, unspecified: Secondary | ICD-10-CM | POA: Diagnosis not present

## 2023-01-23 DIAGNOSIS — D225 Melanocytic nevi of trunk: Secondary | ICD-10-CM | POA: Diagnosis not present

## 2023-01-23 DIAGNOSIS — D485 Neoplasm of uncertain behavior of skin: Secondary | ICD-10-CM | POA: Diagnosis not present

## 2023-01-23 DIAGNOSIS — N189 Chronic kidney disease, unspecified: Secondary | ICD-10-CM | POA: Diagnosis not present

## 2023-01-23 DIAGNOSIS — Z08 Encounter for follow-up examination after completed treatment for malignant neoplasm: Secondary | ICD-10-CM | POA: Diagnosis not present

## 2023-01-23 DIAGNOSIS — M810 Age-related osteoporosis without current pathological fracture: Secondary | ICD-10-CM | POA: Diagnosis not present

## 2023-01-24 ENCOUNTER — Other Ambulatory Visit (HOSPITAL_BASED_OUTPATIENT_CLINIC_OR_DEPARTMENT_OTHER): Payer: Self-pay

## 2023-01-24 MED ORDER — OFLOXACIN 0.3 % OT SOLN
4.0000 [drp] | Freq: Two times a day (BID) | OTIC | 0 refills | Status: DC
  Filled 2023-01-24: qty 5, 7d supply, fill #0

## 2023-01-30 DIAGNOSIS — R972 Elevated prostate specific antigen [PSA]: Secondary | ICD-10-CM | POA: Diagnosis not present

## 2023-01-30 DIAGNOSIS — G4733 Obstructive sleep apnea (adult) (pediatric): Secondary | ICD-10-CM | POA: Diagnosis not present

## 2023-01-30 DIAGNOSIS — Z23 Encounter for immunization: Secondary | ICD-10-CM | POA: Diagnosis not present

## 2023-01-30 DIAGNOSIS — Z0000 Encounter for general adult medical examination without abnormal findings: Secondary | ICD-10-CM | POA: Diagnosis not present

## 2023-01-30 DIAGNOSIS — J387 Other diseases of larynx: Secondary | ICD-10-CM | POA: Diagnosis not present

## 2023-01-30 DIAGNOSIS — N401 Enlarged prostate with lower urinary tract symptoms: Secondary | ICD-10-CM | POA: Diagnosis not present

## 2023-01-30 DIAGNOSIS — N1831 Chronic kidney disease, stage 3a: Secondary | ICD-10-CM | POA: Diagnosis not present

## 2023-01-30 DIAGNOSIS — F411 Generalized anxiety disorder: Secondary | ICD-10-CM | POA: Diagnosis not present

## 2023-01-30 DIAGNOSIS — M810 Age-related osteoporosis without current pathological fracture: Secondary | ICD-10-CM | POA: Diagnosis not present

## 2023-02-08 DIAGNOSIS — C4441 Basal cell carcinoma of skin of scalp and neck: Secondary | ICD-10-CM | POA: Diagnosis not present

## 2023-02-28 IMAGING — US US THYROID
1 series · 14 of 25 positions shown · non-contrast
Comparison: 09/18/2018

CLINICAL DATA: Nodule follow-up

EXAM:
THYROID ULTRASOUND
TECHNIQUE: Ultrasound examination of the thyroid gland and adjacent soft
tissues was performed.

[Series 1: us thyroid · 0.08mm/px · 14 of 42 slices shown]
[im 1/42]
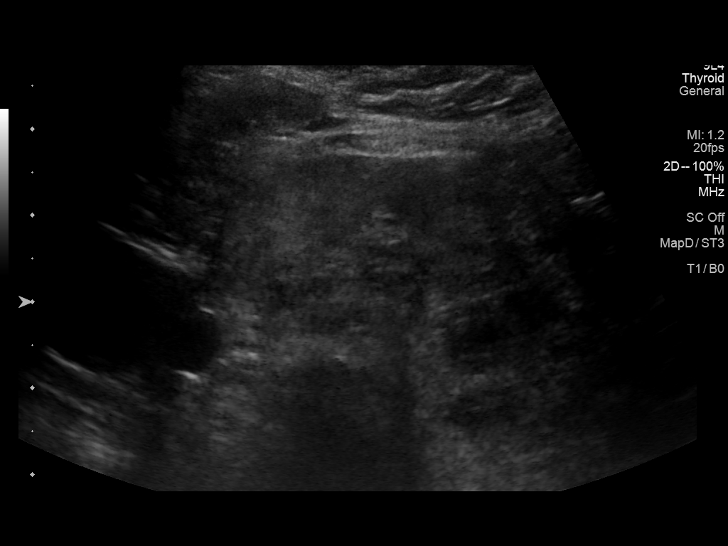
[im 4/42]
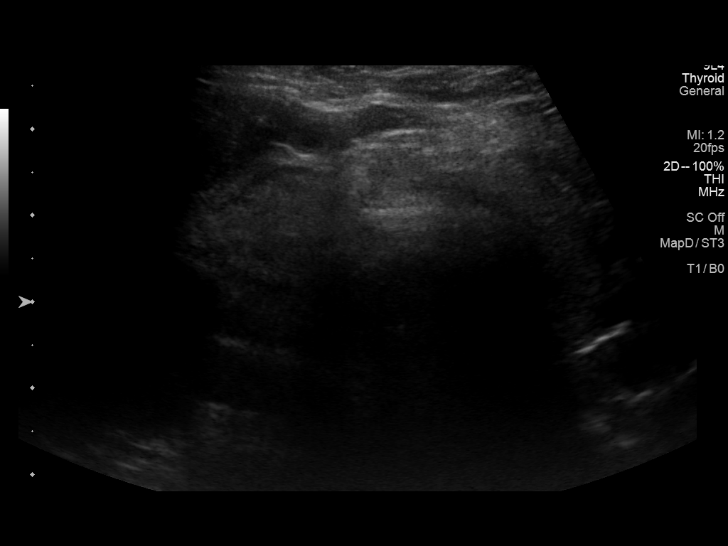
[im 7/42]
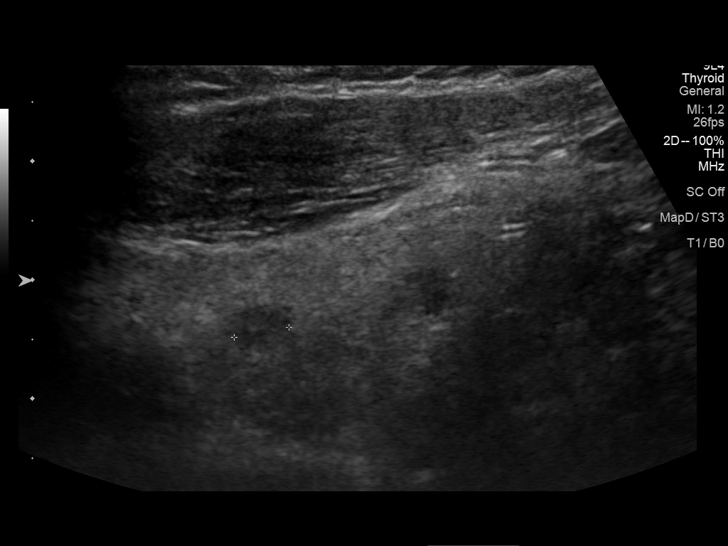
[im 11/42]
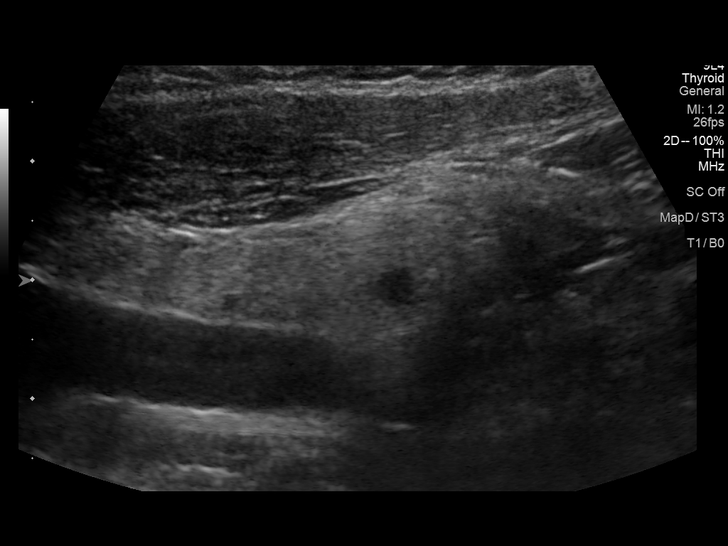
[im 14/42]
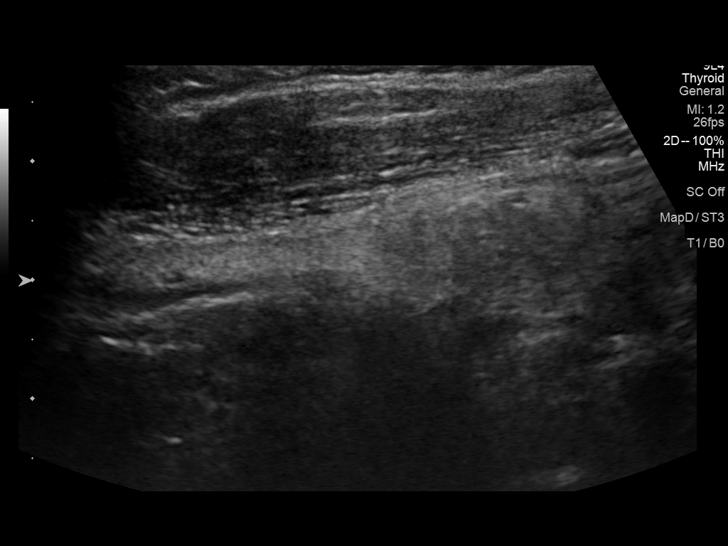
[im 16/42]
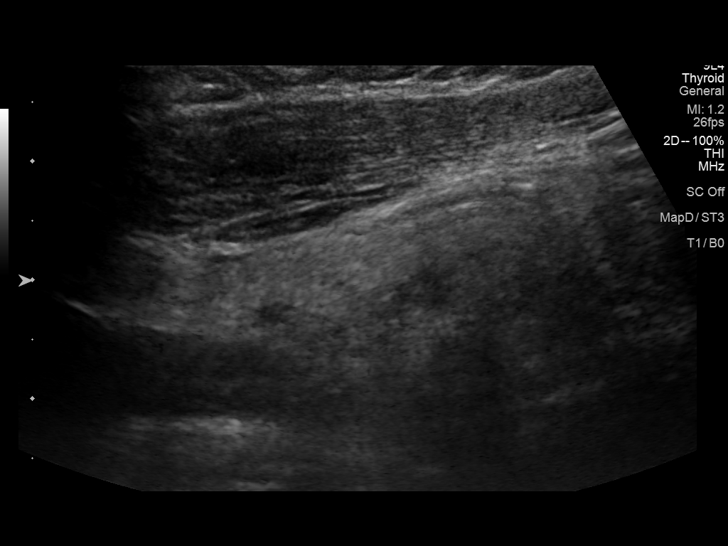
[im 19/42]
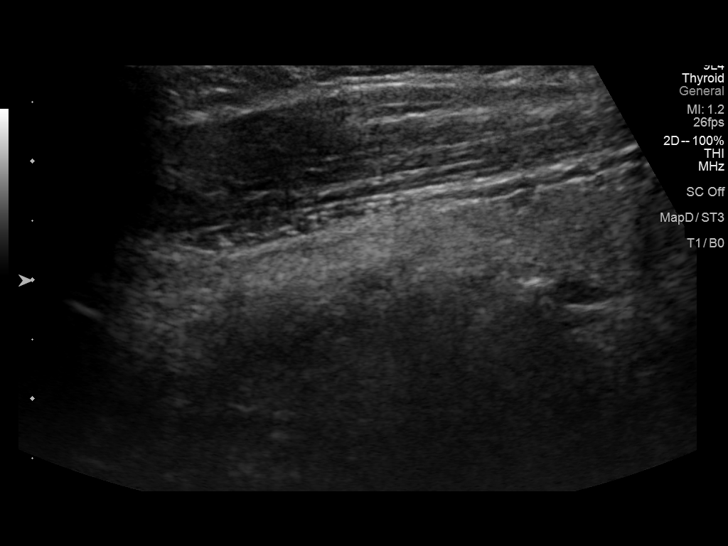
[im 23/42]
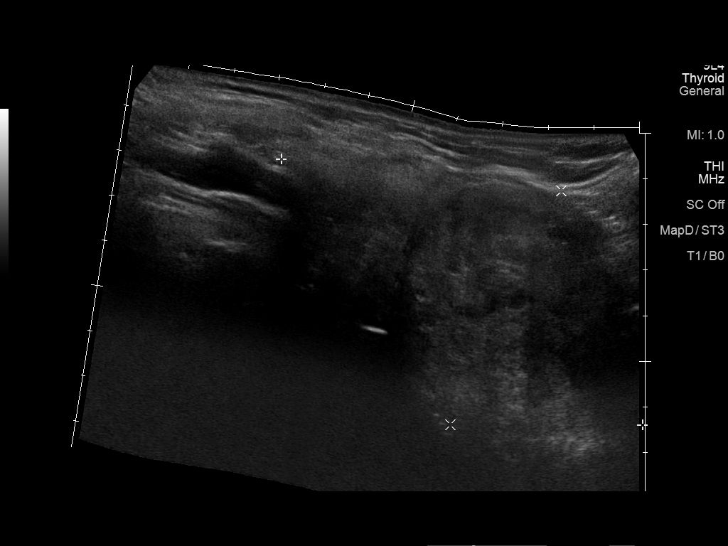
[im 26/42]
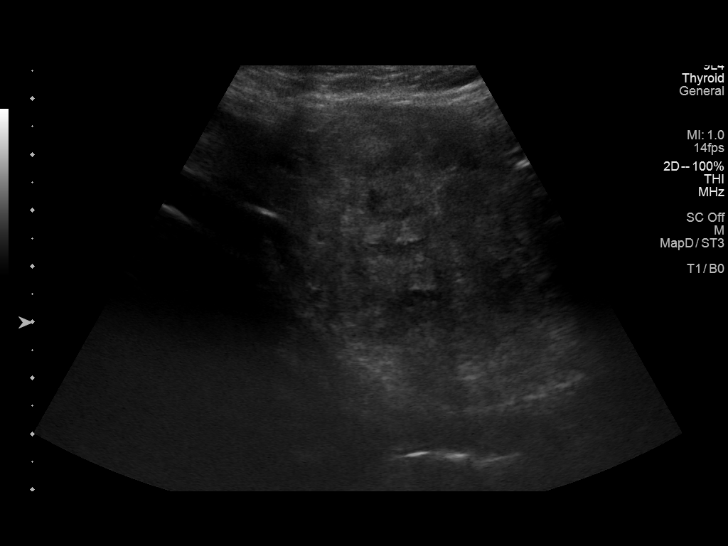
[im 28/42]
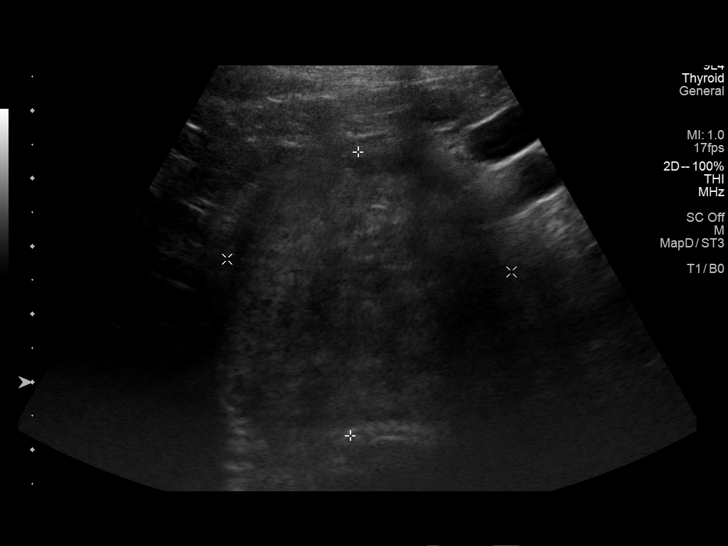
[im 31/42]
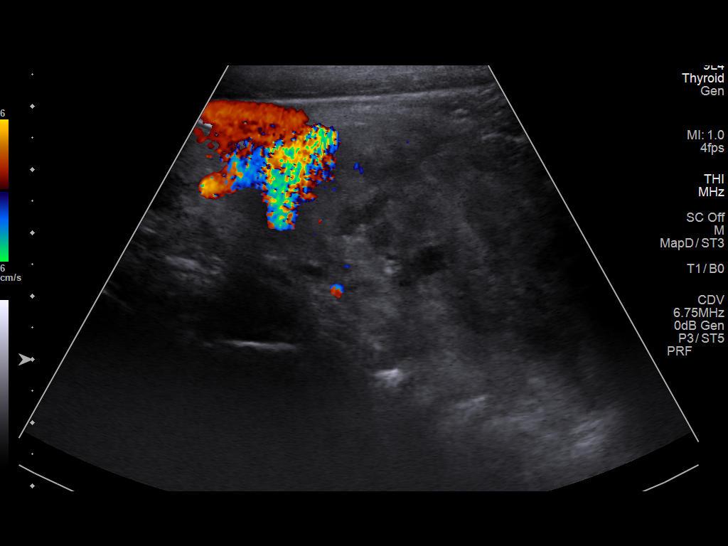
[im 35/42]
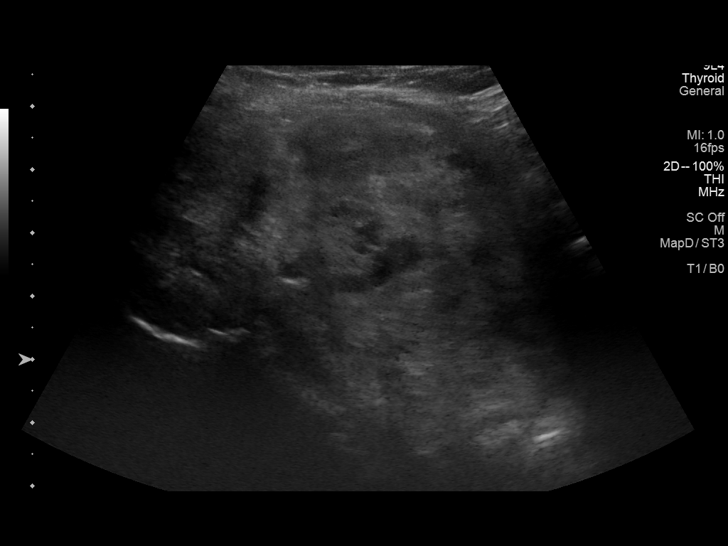
[im 38/42]
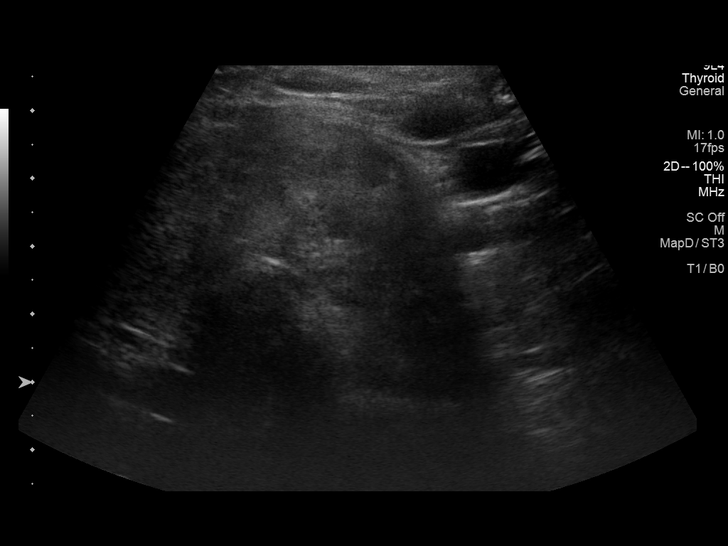
[im 42/42]
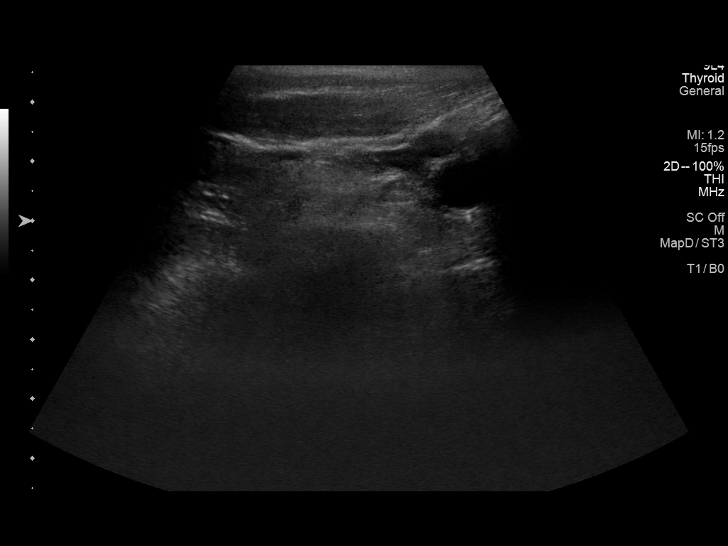

[14 of 25 positions shown; findings below may reference images not displayed]

FINDINGS: Parenchymal Echotexture: Moderately heterogeneous

Isthmus: 2.1 cm

Right lobe: 4.6 x 1.6 x 1.2 cm

Left lobe: 9.7 x 5.6 x 2.4 cm

_________________________________________________________

Estimated total number of nodules >/= 1 cm: 1

Number of spongiform nodules >/=  2 cm not described below (TR1): 0

Number of mixed cystic and solid nodules >/= 1.5 cm not described
below (TR2): 0

_________________________________________________________

Nodule # 1: Previously biopsy 6.3 x 4.2 x 4.2 cm left thyroid nodule
is not significantly changed in size since prior examination. Please
correlate with prior FNA results.

Subcentimeter right thyroid nodule does not meet criteria for FNA or
surveillance.
IMPRESSION: Previously biopsied left thyroid nodule is unchanged in size.

The above is in keeping with the ACR TI-RADS recommendations - [HOSPITAL] 9218;[DATE].

## 2023-03-18 ENCOUNTER — Other Ambulatory Visit (HOSPITAL_BASED_OUTPATIENT_CLINIC_OR_DEPARTMENT_OTHER): Payer: Self-pay

## 2023-03-21 ENCOUNTER — Other Ambulatory Visit (HOSPITAL_BASED_OUTPATIENT_CLINIC_OR_DEPARTMENT_OTHER): Payer: Self-pay

## 2023-04-03 ENCOUNTER — Other Ambulatory Visit (HOSPITAL_BASED_OUTPATIENT_CLINIC_OR_DEPARTMENT_OTHER): Payer: Self-pay

## 2023-04-03 ENCOUNTER — Other Ambulatory Visit: Payer: Self-pay

## 2023-04-04 ENCOUNTER — Other Ambulatory Visit: Payer: Self-pay

## 2023-04-04 ENCOUNTER — Other Ambulatory Visit (HOSPITAL_BASED_OUTPATIENT_CLINIC_OR_DEPARTMENT_OTHER): Payer: Self-pay

## 2023-04-06 ENCOUNTER — Other Ambulatory Visit: Payer: Self-pay

## 2023-04-20 DIAGNOSIS — M810 Age-related osteoporosis without current pathological fracture: Secondary | ICD-10-CM | POA: Diagnosis not present

## 2023-04-20 DIAGNOSIS — R030 Elevated blood-pressure reading, without diagnosis of hypertension: Secondary | ICD-10-CM | POA: Diagnosis not present

## 2023-04-20 DIAGNOSIS — M8589 Other specified disorders of bone density and structure, multiple sites: Secondary | ICD-10-CM | POA: Diagnosis not present

## 2023-04-26 ENCOUNTER — Other Ambulatory Visit (HOSPITAL_BASED_OUTPATIENT_CLINIC_OR_DEPARTMENT_OTHER): Payer: Self-pay

## 2023-04-26 DIAGNOSIS — H6091 Unspecified otitis externa, right ear: Secondary | ICD-10-CM | POA: Diagnosis not present

## 2023-04-26 MED ORDER — CIPROFLOXACIN-DEXAMETHASONE 0.3-0.1 % OT SUSP
4.0000 [drp] | Freq: Two times a day (BID) | OTIC | 0 refills | Status: AC
  Filled 2023-04-26: qty 7.5, 10d supply, fill #0

## 2023-04-27 ENCOUNTER — Other Ambulatory Visit (HOSPITAL_BASED_OUTPATIENT_CLINIC_OR_DEPARTMENT_OTHER): Payer: Self-pay

## 2023-04-27 MED ORDER — OFLOXACIN 0.3 % OT SOLN
4.0000 [drp] | Freq: Two times a day (BID) | OTIC | 0 refills | Status: AC
  Filled 2023-04-27: qty 5, 7d supply, fill #0

## 2023-04-28 ENCOUNTER — Other Ambulatory Visit (HOSPITAL_BASED_OUTPATIENT_CLINIC_OR_DEPARTMENT_OTHER): Payer: Self-pay

## 2023-04-29 ENCOUNTER — Other Ambulatory Visit (HOSPITAL_BASED_OUTPATIENT_CLINIC_OR_DEPARTMENT_OTHER): Payer: Self-pay

## 2023-05-04 DIAGNOSIS — H6091 Unspecified otitis externa, right ear: Secondary | ICD-10-CM | POA: Diagnosis not present

## 2023-06-26 ENCOUNTER — Other Ambulatory Visit (HOSPITAL_BASED_OUTPATIENT_CLINIC_OR_DEPARTMENT_OTHER): Payer: Self-pay

## 2023-06-27 ENCOUNTER — Other Ambulatory Visit: Payer: Self-pay

## 2023-06-27 ENCOUNTER — Other Ambulatory Visit (HOSPITAL_BASED_OUTPATIENT_CLINIC_OR_DEPARTMENT_OTHER): Payer: Self-pay

## 2023-06-27 MED ORDER — TAMSULOSIN HCL 0.4 MG PO CAPS
0.4000 mg | ORAL_CAPSULE | Freq: Every day | ORAL | 3 refills | Status: AC
  Filled 2023-06-27: qty 90, 90d supply, fill #0
  Filled 2023-09-20: qty 90, 90d supply, fill #1
  Filled 2023-12-21: qty 90, 90d supply, fill #2
  Filled 2024-03-20: qty 90, 90d supply, fill #3

## 2023-06-27 MED ORDER — MIRTAZAPINE 15 MG PO TABS
15.0000 mg | ORAL_TABLET | Freq: Every day | ORAL | 1 refills | Status: AC
  Filled 2023-06-27: qty 90, 90d supply, fill #0

## 2023-06-28 ENCOUNTER — Other Ambulatory Visit (HOSPITAL_BASED_OUTPATIENT_CLINIC_OR_DEPARTMENT_OTHER): Payer: Self-pay

## 2023-06-28 ENCOUNTER — Other Ambulatory Visit: Payer: Self-pay

## 2023-06-29 ENCOUNTER — Other Ambulatory Visit (HOSPITAL_BASED_OUTPATIENT_CLINIC_OR_DEPARTMENT_OTHER): Payer: Self-pay

## 2023-07-10 DIAGNOSIS — K2100 Gastro-esophageal reflux disease with esophagitis, without bleeding: Secondary | ICD-10-CM | POA: Diagnosis not present

## 2023-08-09 ENCOUNTER — Other Ambulatory Visit (HOSPITAL_BASED_OUTPATIENT_CLINIC_OR_DEPARTMENT_OTHER): Payer: Self-pay

## 2023-08-09 DIAGNOSIS — D485 Neoplasm of uncertain behavior of skin: Secondary | ICD-10-CM | POA: Diagnosis not present

## 2023-08-09 DIAGNOSIS — L570 Actinic keratosis: Secondary | ICD-10-CM | POA: Diagnosis not present

## 2023-08-09 DIAGNOSIS — D225 Melanocytic nevi of trunk: Secondary | ICD-10-CM | POA: Diagnosis not present

## 2023-08-09 DIAGNOSIS — Z08 Encounter for follow-up examination after completed treatment for malignant neoplasm: Secondary | ICD-10-CM | POA: Diagnosis not present

## 2023-08-09 DIAGNOSIS — C44612 Basal cell carcinoma of skin of right upper limb, including shoulder: Secondary | ICD-10-CM | POA: Diagnosis not present

## 2023-08-09 DIAGNOSIS — L814 Other melanin hyperpigmentation: Secondary | ICD-10-CM | POA: Diagnosis not present

## 2023-08-09 DIAGNOSIS — Z85828 Personal history of other malignant neoplasm of skin: Secondary | ICD-10-CM | POA: Diagnosis not present

## 2023-08-09 DIAGNOSIS — Z872 Personal history of diseases of the skin and subcutaneous tissue: Secondary | ICD-10-CM | POA: Diagnosis not present

## 2023-08-09 DIAGNOSIS — L821 Other seborrheic keratosis: Secondary | ICD-10-CM | POA: Diagnosis not present

## 2023-08-10 ENCOUNTER — Other Ambulatory Visit (HOSPITAL_BASED_OUTPATIENT_CLINIC_OR_DEPARTMENT_OTHER): Payer: Self-pay

## 2023-08-21 ENCOUNTER — Other Ambulatory Visit (HOSPITAL_BASED_OUTPATIENT_CLINIC_OR_DEPARTMENT_OTHER): Payer: Self-pay

## 2023-08-21 DIAGNOSIS — K219 Gastro-esophageal reflux disease without esophagitis: Secondary | ICD-10-CM | POA: Diagnosis not present

## 2023-08-21 DIAGNOSIS — F411 Generalized anxiety disorder: Secondary | ICD-10-CM | POA: Diagnosis not present

## 2023-08-21 MED ORDER — MIRTAZAPINE 30 MG PO TABS
30.0000 mg | ORAL_TABLET | Freq: Every day | ORAL | 3 refills | Status: AC
  Filled 2023-08-21: qty 90, 90d supply, fill #0
  Filled 2023-11-14: qty 90, 90d supply, fill #1
  Filled 2024-02-13: qty 90, 90d supply, fill #2

## 2023-09-25 ENCOUNTER — Other Ambulatory Visit (HOSPITAL_BASED_OUTPATIENT_CLINIC_OR_DEPARTMENT_OTHER): Payer: Self-pay

## 2023-09-29 DIAGNOSIS — C44612 Basal cell carcinoma of skin of right upper limb, including shoulder: Secondary | ICD-10-CM | POA: Diagnosis not present

## 2023-10-02 ENCOUNTER — Other Ambulatory Visit (HOSPITAL_BASED_OUTPATIENT_CLINIC_OR_DEPARTMENT_OTHER): Payer: Self-pay

## 2023-10-02 MED ORDER — AMOXICILLIN 500 MG PO CAPS
500.0000 mg | ORAL_CAPSULE | Freq: Three times a day (TID) | ORAL | 0 refills | Status: AC
  Filled 2023-10-02: qty 21, 7d supply, fill #0

## 2023-10-10 ENCOUNTER — Other Ambulatory Visit (HOSPITAL_BASED_OUTPATIENT_CLINIC_OR_DEPARTMENT_OTHER): Payer: Self-pay

## 2023-10-10 MED ORDER — DEXLANSOPRAZOLE 60 MG PO CPDR
60.0000 mg | DELAYED_RELEASE_CAPSULE | Freq: Every day | ORAL | 3 refills | Status: AC
  Filled 2023-10-10 – 2023-10-16 (×2): qty 100, 100d supply, fill #0
  Filled 2024-02-13: qty 100, 100d supply, fill #1

## 2023-10-16 ENCOUNTER — Other Ambulatory Visit: Payer: Self-pay

## 2023-10-16 ENCOUNTER — Other Ambulatory Visit (HOSPITAL_BASED_OUTPATIENT_CLINIC_OR_DEPARTMENT_OTHER): Payer: Self-pay

## 2023-10-23 DIAGNOSIS — E042 Nontoxic multinodular goiter: Secondary | ICD-10-CM | POA: Diagnosis not present

## 2023-10-26 ENCOUNTER — Other Ambulatory Visit: Payer: Self-pay | Admitting: Nurse Practitioner

## 2023-10-26 DIAGNOSIS — E042 Nontoxic multinodular goiter: Secondary | ICD-10-CM

## 2023-10-27 ENCOUNTER — Ambulatory Visit
Admission: RE | Admit: 2023-10-27 | Discharge: 2023-10-27 | Disposition: A | Discharge status: Home / Self Care | Source: Ambulatory Visit | Attending: Nurse Practitioner | Admitting: Nurse Practitioner

## 2023-10-27 DIAGNOSIS — E042 Nontoxic multinodular goiter: Secondary | ICD-10-CM | POA: Diagnosis not present

## 2023-10-30 DIAGNOSIS — E042 Nontoxic multinodular goiter: Secondary | ICD-10-CM | POA: Diagnosis not present

## 2023-11-03 ENCOUNTER — Encounter: Payer: Self-pay | Admitting: Advanced Practice Midwife

## 2023-11-24 ENCOUNTER — Other Ambulatory Visit (HOSPITAL_BASED_OUTPATIENT_CLINIC_OR_DEPARTMENT_OTHER): Payer: Self-pay

## 2023-11-24 MED ORDER — ALBUTEROL SULFATE HFA 108 (90 BASE) MCG/ACT IN AERS
1.0000 | INHALATION_SPRAY | Freq: Four times a day (QID) | RESPIRATORY_TRACT | 3 refills | Status: AC | PRN
  Filled 2023-11-24: qty 20.1, 90d supply, fill #0

## 2024-01-10 ENCOUNTER — Other Ambulatory Visit (HOSPITAL_BASED_OUTPATIENT_CLINIC_OR_DEPARTMENT_OTHER): Payer: Self-pay

## 2024-01-10 MED ORDER — AMOXICILLIN 500 MG PO CAPS
500.0000 mg | ORAL_CAPSULE | Freq: Three times a day (TID) | ORAL | 0 refills | Status: AC
  Filled 2024-01-10: qty 21, 7d supply, fill #0

## 2024-01-24 DIAGNOSIS — H524 Presbyopia: Secondary | ICD-10-CM | POA: Diagnosis not present

## 2024-01-24 DIAGNOSIS — H25813 Combined forms of age-related cataract, bilateral: Secondary | ICD-10-CM | POA: Diagnosis not present

## 2024-01-24 DIAGNOSIS — H40013 Open angle with borderline findings, low risk, bilateral: Secondary | ICD-10-CM | POA: Diagnosis not present

## 2024-01-29 DIAGNOSIS — D72819 Decreased white blood cell count, unspecified: Secondary | ICD-10-CM | POA: Diagnosis not present

## 2024-01-29 DIAGNOSIS — Z13220 Encounter for screening for lipoid disorders: Secondary | ICD-10-CM | POA: Diagnosis not present

## 2024-01-29 DIAGNOSIS — M810 Age-related osteoporosis without current pathological fracture: Secondary | ICD-10-CM | POA: Diagnosis not present

## 2024-02-02 DIAGNOSIS — R972 Elevated prostate specific antigen [PSA]: Secondary | ICD-10-CM | POA: Diagnosis not present

## 2024-02-02 DIAGNOSIS — M810 Age-related osteoporosis without current pathological fracture: Secondary | ICD-10-CM | POA: Diagnosis not present

## 2024-02-02 DIAGNOSIS — Z0000 Encounter for general adult medical examination without abnormal findings: Secondary | ICD-10-CM | POA: Diagnosis not present

## 2024-02-02 DIAGNOSIS — B8809 Other acariasis: Secondary | ICD-10-CM | POA: Diagnosis not present

## 2024-02-02 DIAGNOSIS — G4733 Obstructive sleep apnea (adult) (pediatric): Secondary | ICD-10-CM | POA: Diagnosis not present

## 2024-02-02 DIAGNOSIS — Z23 Encounter for immunization: Secondary | ICD-10-CM | POA: Diagnosis not present

## 2024-02-02 DIAGNOSIS — J387 Other diseases of larynx: Secondary | ICD-10-CM | POA: Diagnosis not present

## 2024-02-02 DIAGNOSIS — N401 Enlarged prostate with lower urinary tract symptoms: Secondary | ICD-10-CM | POA: Diagnosis not present

## 2024-02-02 DIAGNOSIS — F411 Generalized anxiety disorder: Secondary | ICD-10-CM | POA: Diagnosis not present

## 2024-02-08 DIAGNOSIS — R399 Unspecified symptoms and signs involving the genitourinary system: Secondary | ICD-10-CM | POA: Diagnosis not present

## 2024-02-08 DIAGNOSIS — R972 Elevated prostate specific antigen [PSA]: Secondary | ICD-10-CM | POA: Diagnosis not present

## 2024-02-12 ENCOUNTER — Other Ambulatory Visit: Payer: Self-pay | Admitting: Adult Health

## 2024-02-12 DIAGNOSIS — Z85828 Personal history of other malignant neoplasm of skin: Secondary | ICD-10-CM | POA: Diagnosis not present

## 2024-02-12 DIAGNOSIS — R972 Elevated prostate specific antigen [PSA]: Secondary | ICD-10-CM

## 2024-02-12 DIAGNOSIS — Z08 Encounter for follow-up examination after completed treatment for malignant neoplasm: Secondary | ICD-10-CM | POA: Diagnosis not present

## 2024-02-12 DIAGNOSIS — L821 Other seborrheic keratosis: Secondary | ICD-10-CM | POA: Diagnosis not present

## 2024-02-12 DIAGNOSIS — L814 Other melanin hyperpigmentation: Secondary | ICD-10-CM | POA: Diagnosis not present

## 2024-02-12 DIAGNOSIS — D1801 Hemangioma of skin and subcutaneous tissue: Secondary | ICD-10-CM | POA: Diagnosis not present

## 2024-02-13 ENCOUNTER — Other Ambulatory Visit (HOSPITAL_BASED_OUTPATIENT_CLINIC_OR_DEPARTMENT_OTHER): Payer: Self-pay

## 2024-02-14 ENCOUNTER — Other Ambulatory Visit (HOSPITAL_BASED_OUTPATIENT_CLINIC_OR_DEPARTMENT_OTHER): Payer: Self-pay

## 2024-02-14 ENCOUNTER — Other Ambulatory Visit: Payer: Self-pay

## 2024-03-23 ENCOUNTER — Ambulatory Visit
Admission: RE | Admit: 2024-03-23 | Discharge: 2024-03-23 | Disposition: A | Discharge status: Home / Self Care | Source: Ambulatory Visit | Attending: Adult Health | Admitting: Adult Health

## 2024-03-23 ENCOUNTER — Other Ambulatory Visit (HOSPITAL_BASED_OUTPATIENT_CLINIC_OR_DEPARTMENT_OTHER): Payer: Self-pay

## 2024-03-23 DIAGNOSIS — R972 Elevated prostate specific antigen [PSA]: Secondary | ICD-10-CM

## 2024-03-23 DIAGNOSIS — N400 Enlarged prostate without lower urinary tract symptoms: Secondary | ICD-10-CM | POA: Diagnosis not present

## 2024-03-23 MED ORDER — GADOPICLENOL 0.5 MMOL/ML IV SOLN
9.0000 mL | Freq: Once | INTRAVENOUS | Status: AC | PRN
  Administered 2024-03-23: 9 mL via INTRAVENOUS

## 2024-04-23 ENCOUNTER — Other Ambulatory Visit (HOSPITAL_BASED_OUTPATIENT_CLINIC_OR_DEPARTMENT_OTHER): Payer: Self-pay
